# Patient Record
Sex: Female | Born: 1970 | Race: White | Hispanic: No | Marital: Married | Smoking: Former smoker
Health system: Southern US, Community
[De-identification: ages and names within clinical notes are randomized; demographics above are authoritative.]

## PROBLEM LIST (undated history)

## (undated) DIAGNOSIS — R109 Unspecified abdominal pain: Secondary | ICD-10-CM

## (undated) DIAGNOSIS — K921 Melena: Secondary | ICD-10-CM

## (undated) DIAGNOSIS — E538 Deficiency of other specified B group vitamins: Secondary | ICD-10-CM

## (undated) DIAGNOSIS — K219 Gastro-esophageal reflux disease without esophagitis: Secondary | ICD-10-CM

## (undated) DIAGNOSIS — R6884 Jaw pain: Secondary | ICD-10-CM

## (undated) DIAGNOSIS — R232 Flushing: Secondary | ICD-10-CM

## (undated) DIAGNOSIS — K2270 Barrett's esophagus without dysplasia: Secondary | ICD-10-CM

## (undated) DIAGNOSIS — D509 Iron deficiency anemia, unspecified: Secondary | ICD-10-CM

## (undated) DIAGNOSIS — M461 Sacroiliitis, not elsewhere classified: Secondary | ICD-10-CM

## (undated) DIAGNOSIS — R142 Eructation: Secondary | ICD-10-CM

## (undated) DIAGNOSIS — R5383 Other fatigue: Secondary | ICD-10-CM

## (undated) DIAGNOSIS — R112 Nausea with vomiting, unspecified: Secondary | ICD-10-CM

## (undated) DIAGNOSIS — R634 Abnormal weight loss: Secondary | ICD-10-CM

## (undated) DIAGNOSIS — R141 Gas pain: Secondary | ICD-10-CM

## (undated) DIAGNOSIS — M25519 Pain in unspecified shoulder: Secondary | ICD-10-CM

## (undated) DIAGNOSIS — K5900 Constipation, unspecified: Secondary | ICD-10-CM

## (undated) DIAGNOSIS — R197 Diarrhea, unspecified: Secondary | ICD-10-CM

## (undated) DIAGNOSIS — J449 Chronic obstructive pulmonary disease, unspecified: Secondary | ICD-10-CM

## (undated) DIAGNOSIS — F32A Depression, unspecified: Secondary | ICD-10-CM

## (undated) DIAGNOSIS — E785 Hyperlipidemia, unspecified: Secondary | ICD-10-CM

## (undated) DIAGNOSIS — R079 Chest pain, unspecified: Secondary | ICD-10-CM

## (undated) DIAGNOSIS — R110 Nausea: Secondary | ICD-10-CM

## (undated) DIAGNOSIS — Z9889 Other specified postprocedural states: Secondary | ICD-10-CM

## (undated) DIAGNOSIS — K635 Polyp of colon: Secondary | ICD-10-CM

## (undated) DIAGNOSIS — R143 Flatulence: Secondary | ICD-10-CM

## (undated) DIAGNOSIS — F419 Anxiety disorder, unspecified: Secondary | ICD-10-CM

## (undated) DIAGNOSIS — M5412 Radiculopathy, cervical region: Secondary | ICD-10-CM

## (undated) HISTORY — DX: Hyperlipidemia, unspecified: E78.5

## (undated) HISTORY — DX: Nausea: R11.0

## (undated) HISTORY — DX: Other fatigue: R53.83

## (undated) HISTORY — DX: Jaw pain: R68.84

## (undated) HISTORY — DX: Gas pain: R14.1

## (undated) HISTORY — PX: TUBAL LIGATION: SHX77

## (undated) HISTORY — DX: Gastro-esophageal reflux disease without esophagitis: K21.9

## (undated) HISTORY — DX: Radiculopathy, cervical region: M54.12

## (undated) HISTORY — DX: Flushing: R23.2

## (undated) HISTORY — DX: Iron deficiency anemia, unspecified: D50.9

## (undated) HISTORY — DX: Chest pain, unspecified: R07.9

## (undated) HISTORY — DX: Anxiety disorder, unspecified: F41.9

## (undated) HISTORY — DX: Eructation: R14.2

## (undated) HISTORY — DX: Depression, unspecified: F32.A

## (undated) HISTORY — PX: CHOLECYSTECTOMY: SHX55

## (undated) HISTORY — DX: Diarrhea, unspecified: R19.7

## (undated) HISTORY — PX: COLONOSCOPY: SHX174

## (undated) HISTORY — DX: Polyp of colon: K63.5

## (undated) HISTORY — PX: FOOT SURGERY: SHX648

## (undated) HISTORY — DX: Constipation, unspecified: K59.00

## (undated) HISTORY — DX: Unspecified abdominal pain: R10.9

## (undated) HISTORY — DX: Sacroiliitis, not elsewhere classified: M46.1

## (undated) HISTORY — DX: Pain in unspecified shoulder: M25.519

## (undated) HISTORY — DX: Flatulence: R14.3

## (undated) HISTORY — DX: Melena: K92.1

## (undated) HISTORY — DX: Deficiency of other specified B group vitamins: E53.8

## (undated) HISTORY — DX: Abnormal weight loss: R63.4

## (undated) HISTORY — DX: Chronic obstructive pulmonary disease, unspecified: J44.9

---

## 1997-09-01 ENCOUNTER — Other Ambulatory Visit: Admission: RE | Admit: 1997-09-01 | Discharge: 1997-09-01 | Payer: Self-pay | Admitting: Obstetrics & Gynecology

## 1998-01-24 ENCOUNTER — Inpatient Hospital Stay (HOSPITAL_COMMUNITY): Admission: AD | Admit: 1998-01-24 | Discharge: 1998-01-24 | Payer: Self-pay | Admitting: Obstetrics and Gynecology

## 1998-04-04 ENCOUNTER — Inpatient Hospital Stay (HOSPITAL_COMMUNITY): Admission: AD | Admit: 1998-04-04 | Discharge: 1998-04-04 | Payer: Self-pay | Admitting: *Deleted

## 1998-04-12 ENCOUNTER — Inpatient Hospital Stay (HOSPITAL_COMMUNITY): Admission: AD | Admit: 1998-04-12 | Discharge: 1998-04-14 | Payer: Self-pay | Admitting: *Deleted

## 1998-05-28 ENCOUNTER — Ambulatory Visit (HOSPITAL_COMMUNITY): Admission: RE | Admit: 1998-05-28 | Discharge: 1998-05-28 | Payer: Self-pay | Admitting: Obstetrics and Gynecology

## 2000-01-21 ENCOUNTER — Emergency Department (HOSPITAL_COMMUNITY): Admission: EM | Admit: 2000-01-21 | Discharge: 2000-01-22 | Payer: Self-pay | Admitting: Emergency Medicine

## 2000-01-24 ENCOUNTER — Other Ambulatory Visit: Admission: RE | Admit: 2000-01-24 | Discharge: 2000-01-24 | Payer: Self-pay | Admitting: Obstetrics & Gynecology

## 2004-03-11 ENCOUNTER — Emergency Department (HOSPITAL_COMMUNITY): Admission: EM | Admit: 2004-03-11 | Discharge: 2004-03-11 | Payer: Self-pay | Admitting: Emergency Medicine

## 2005-07-30 ENCOUNTER — Emergency Department (HOSPITAL_COMMUNITY): Admission: EM | Admit: 2005-07-30 | Discharge: 2005-07-30 | Payer: Self-pay | Admitting: Emergency Medicine

## 2007-01-25 ENCOUNTER — Observation Stay (HOSPITAL_COMMUNITY): Admission: EM | Admit: 2007-01-25 | Discharge: 2007-01-26 | Payer: Self-pay | Admitting: Emergency Medicine

## 2009-03-05 ENCOUNTER — Encounter (INDEPENDENT_AMBULATORY_CARE_PROVIDER_SITE_OTHER): Payer: Self-pay | Admitting: *Deleted

## 2009-04-06 ENCOUNTER — Telehealth (INDEPENDENT_AMBULATORY_CARE_PROVIDER_SITE_OTHER): Payer: Self-pay | Admitting: *Deleted

## 2009-04-10 ENCOUNTER — Encounter (INDEPENDENT_AMBULATORY_CARE_PROVIDER_SITE_OTHER): Payer: Self-pay | Admitting: *Deleted

## 2009-04-10 ENCOUNTER — Ambulatory Visit: Payer: Self-pay | Admitting: Gastroenterology

## 2009-04-10 DIAGNOSIS — R198 Other specified symptoms and signs involving the digestive system and abdomen: Secondary | ICD-10-CM | POA: Insufficient documentation

## 2009-04-10 DIAGNOSIS — K921 Melena: Secondary | ICD-10-CM

## 2009-04-10 DIAGNOSIS — R143 Flatulence: Secondary | ICD-10-CM

## 2009-04-10 DIAGNOSIS — K5900 Constipation, unspecified: Secondary | ICD-10-CM | POA: Insufficient documentation

## 2009-04-10 DIAGNOSIS — J449 Chronic obstructive pulmonary disease, unspecified: Secondary | ICD-10-CM

## 2009-04-10 DIAGNOSIS — R142 Eructation: Secondary | ICD-10-CM

## 2009-04-10 DIAGNOSIS — R197 Diarrhea, unspecified: Secondary | ICD-10-CM

## 2009-04-10 DIAGNOSIS — R109 Unspecified abdominal pain: Secondary | ICD-10-CM | POA: Insufficient documentation

## 2009-04-10 DIAGNOSIS — R141 Gas pain: Secondary | ICD-10-CM | POA: Insufficient documentation

## 2009-04-10 DIAGNOSIS — J4489 Other specified chronic obstructive pulmonary disease: Secondary | ICD-10-CM

## 2009-04-10 DIAGNOSIS — R634 Abnormal weight loss: Secondary | ICD-10-CM | POA: Insufficient documentation

## 2009-04-10 HISTORY — DX: Abnormal weight loss: R63.4

## 2009-04-10 HISTORY — DX: Flatulence: R14.3

## 2009-04-10 HISTORY — DX: Constipation, unspecified: K59.00

## 2009-04-10 HISTORY — DX: Melena: K92.1

## 2009-04-10 HISTORY — DX: Other specified chronic obstructive pulmonary disease: J44.89

## 2009-04-10 HISTORY — DX: Unspecified abdominal pain: R10.9

## 2009-04-10 HISTORY — DX: Chronic obstructive pulmonary disease, unspecified: J44.9

## 2009-04-10 HISTORY — DX: Gas pain: R14.1

## 2009-04-10 HISTORY — DX: Diarrhea, unspecified: R19.7

## 2009-04-12 DIAGNOSIS — D509 Iron deficiency anemia, unspecified: Secondary | ICD-10-CM | POA: Insufficient documentation

## 2009-04-12 DIAGNOSIS — E538 Deficiency of other specified B group vitamins: Secondary | ICD-10-CM

## 2009-04-12 HISTORY — DX: Deficiency of other specified B group vitamins: E53.8

## 2009-04-12 HISTORY — DX: Iron deficiency anemia, unspecified: D50.9

## 2009-04-12 LAB — CONVERTED CEMR LAB
ALT: 16 units/L (ref 0–35)
AST: 18 units/L (ref 0–37)
Albumin: 4.3 g/dL (ref 3.5–5.2)
Amylase: 74 units/L (ref 27–131)
BUN: 15 mg/dL (ref 6–23)
Basophils Relative: 0 % (ref 0.0–3.0)
CO2: 27 meq/L (ref 19–32)
CRP, High Sensitivity: 0 (ref 0.00–5.00)
Chloride: 105 meq/L (ref 96–112)
Creatinine, Ser: 0.7 mg/dL (ref 0.4–1.2)
Eosinophils Absolute: 0.1 10*3/uL (ref 0.0–0.7)
Eosinophils Relative: 1.2 % (ref 0.0–5.0)
Glucose, Bld: 96 mg/dL (ref 70–99)
Hemoglobin: 13.8 g/dL (ref 12.0–15.0)
IgA: 255 mg/dL (ref 68–378)
Lipase: 23 units/L (ref 11.0–59.0)
Lymphocytes Relative: 31.7 % (ref 12.0–46.0)
MCHC: 34.2 g/dL (ref 30.0–36.0)
MCV: 91.3 fL (ref 78.0–100.0)
Neutro Abs: 5.3 10*3/uL (ref 1.4–7.7)
Potassium: 3.6 meq/L (ref 3.5–5.1)
RBC: 4.43 M/uL (ref 3.87–5.11)
Sed Rate: 8 mm/hr (ref 0–22)
TSH: 1.21 microintl units/mL (ref 0.35–5.50)
Total Bilirubin: 0.2 mg/dL — ABNORMAL LOW (ref 0.3–1.2)
Total Protein: 6.6 g/dL (ref 6.0–8.3)
Vitamin B-12: 265 pg/mL (ref 211–911)
WBC: 8.3 10*3/uL (ref 4.5–10.5)

## 2009-04-13 LAB — CONVERTED CEMR LAB: Tissue Transglutaminase Ab, IgA: 0.3 units (ref <7)

## 2009-04-16 ENCOUNTER — Ambulatory Visit: Payer: Self-pay | Admitting: Gastroenterology

## 2009-04-18 ENCOUNTER — Encounter: Payer: Self-pay | Admitting: Gastroenterology

## 2009-05-02 ENCOUNTER — Telehealth (INDEPENDENT_AMBULATORY_CARE_PROVIDER_SITE_OTHER): Payer: Self-pay | Admitting: *Deleted

## 2009-05-25 ENCOUNTER — Encounter: Payer: Self-pay | Admitting: Family Medicine

## 2009-06-19 ENCOUNTER — Emergency Department (HOSPITAL_BASED_OUTPATIENT_CLINIC_OR_DEPARTMENT_OTHER): Admission: EM | Admit: 2009-06-19 | Discharge: 2009-06-19 | Payer: Self-pay | Admitting: Emergency Medicine

## 2009-06-19 ENCOUNTER — Telehealth: Payer: Self-pay | Admitting: Gastroenterology

## 2009-06-19 ENCOUNTER — Ambulatory Visit: Payer: Self-pay | Admitting: Diagnostic Radiology

## 2009-06-26 ENCOUNTER — Encounter: Payer: Self-pay | Admitting: Family Medicine

## 2009-06-26 ENCOUNTER — Ambulatory Visit: Payer: Self-pay | Admitting: Sports Medicine

## 2009-06-26 DIAGNOSIS — M461 Sacroiliitis, not elsewhere classified: Secondary | ICD-10-CM

## 2009-06-26 DIAGNOSIS — M5412 Radiculopathy, cervical region: Secondary | ICD-10-CM

## 2009-06-26 HISTORY — DX: Sacroiliitis, not elsewhere classified: M46.1

## 2009-06-26 HISTORY — DX: Radiculopathy, cervical region: M54.12

## 2009-07-04 ENCOUNTER — Telehealth (INDEPENDENT_AMBULATORY_CARE_PROVIDER_SITE_OTHER): Payer: Self-pay | Admitting: *Deleted

## 2009-07-12 ENCOUNTER — Ambulatory Visit: Payer: Self-pay | Admitting: Gastroenterology

## 2009-07-20 ENCOUNTER — Ambulatory Visit: Payer: Self-pay | Admitting: Gastroenterology

## 2009-07-27 ENCOUNTER — Ambulatory Visit: Payer: Self-pay | Admitting: Gastroenterology

## 2009-11-21 ENCOUNTER — Emergency Department (HOSPITAL_COMMUNITY): Admission: EM | Admit: 2009-11-21 | Discharge: 2009-11-21 | Payer: Self-pay | Admitting: Emergency Medicine

## 2010-03-26 NOTE — Assessment & Plan Note (Signed)
Summary: rectal bleeding/weight loss--ch.   History of Present Illness Visit Type: Initial Visit Primary Provider: n/a Chief Complaint: Patient c/o RLQ abdominal pain which occurs almost constantly. She also c/o early satiety along with loss of appetite and visible bloating. She has had increased nausea but no vomiting. There has also been a 27 pound weight loss since November 2010. There is alternating constipation and diarrhea along with several epidsoes of dark black stools since Thanksgiving. History of Present Illness:   40 year old Caucasian female presents with a three-month history of crampy lower abdominal pain, melena, periodic diarrhea alternating with constipation and occasional melanotic stools.  Dondra appear less severe endometriosis in 2004 and was involved in a [facility] study and apparently had laparoscopy with lysis of multiple adhesions. She had done well until recently when she said lower bowel pain and abnormal menstrual bleeding. Apparently evaluation by Dr. Arelia Sneddon has showed an ovarian cyst but no evidence of recurrent endometriosis. She currently is on no medications. Her GI complaints seem unrelated to her gynecologic issues. She mostly has abdominal cramping, alternating diarrhea and constipation, low-grade fever, night sweats, arthralgias in her hands and feet, but no skin rashes, mouth sores or any hepatobiliary or upper gastrointestinal problems. She has not had previous GI barium studies or endoscopic exams.  She does use frequent NSAIDs which may be playing a role in her illness. She smokes at least a pack of cigarettes per day and denies ethanol abuse or any history of pancreatitis or hepatitis. Her family history is remarkable for colon polyps in  multiple family members.   GI Review of Systems    Reports abdominal pain, bloating, loss of appetite, nausea, and  weight loss.     Location of  Abdominal pain: RLQ. Weight loss of 27 pounds over 3 months.   Denies acid  reflux, belching, chest pain, dysphagia with liquids, dysphagia with solids, heartburn, vomiting, vomiting blood, and  weight gain.      Reports black tarry stools, change in bowel habits, constipation, diarrhea, rectal bleeding, and  rectal pain.     Denies anal fissure, diverticulosis, fecal incontinence, heme positive stool, hemorrhoids, irritable bowel syndrome, jaundice, light color stool, and  liver problems. Preventive Screening-Counseling & Management  Alcohol-Tobacco     Smoking Status: current      Drug Use:  no.      Current Medications (verified): 1)  None  Allergies (verified): No Known Drug Allergies  Past History:  Past medical, surgical, family and social histories (including risk factors) reviewed for relevance to current acute and chronic problems.  Past Medical History: Current Problems:  COPD (ICD-496) ABDOMINAL BLOATING (ICD-787.3) DIARRHEA (ICD-787.91) CONSTIPATION (ICD-564.00) EARLY SATIETY (ICD-789.9) WEIGHT LOSS (ICD-783.21) ABDOMINAL PAIN, UNSPECIFIED SITE (ICD-789.00) MELENA (ICD-578.1)    Past Surgical History: Tubal Ligation Lysis of Adhesions  Family History: Reviewed history and no changes required. Family History of Colon Cancer: Paternal Grandmother Family History of Colon Polyps: Grandmother, Father, Aunt x 2 Family History of Clotting disorder: Father  Social History: Reviewed history and no changes required. Patient currently smokes. -1 ppd Alcohol Use - yes-occasional Daily Caffeine Use-1-2 cups daily Illicit Drug Use - no Smoking Status:  current Drug Use:  no  Review of Systems       The patient complains of back pain, fatigue, menstrual pain, muscle pains/cramps, night sweats, nosebleeds, swollen lymph glands, and urination - excessive.  The patient denies allergy/sinus, anemia, anxiety-new, arthritis/joint pain, blood in urine, breast changes/lumps, change in vision, confusion, cough, coughing up  blood, depression-new,  fainting, fever, headaches-new, hearing problems, heart murmur, heart rhythm changes, itching, pregnancy symptoms, shortness of breath, skin rash, sleeping problems, sore throat, thirst - excessive , urination - excessive , urination changes/pain, urine leakage, vision changes, and voice change.   General:  Complains of sweats and weight loss; denies fever, chills, anorexia, fatigue, weakness, malaise, and sleep disorder; she apparently has lost 27 pounds in weight over the last several months. She denies nausea vomiting or early satiety.. Eyes:  Denies blurring, diplopia, irritation, discharge, vision loss, scotoma, eye pain, and photophobia. ENT:  Complains of nosebleeds; denies earache, ear discharge, tinnitus, decreased hearing, nasal congestion, loss of smell, sore throat, hoarseness, and difficulty swallowing. CV:  Denies chest pains, angina, palpitations, syncope, dyspnea on exertion, orthopnea, PND, peripheral edema, and claudication. Resp:  Complains of dyspnea with exercise and wheezing; denies dyspnea at rest, cough, sputum, coughing up blood, and pleurisy. GI:  Complains of diarrhea, constipation, change in bowel habits, and black BMs; denies difficulty swallowing, pain on swallowing, nausea, indigestion/heartburn, vomiting, vomiting blood, abdominal pain, jaundice, gas/bloating, bloody BM's, and fecal incontinence. GU:  Complains of urinary frequency; denies urinary burning, blood in urine, nocturnal urination, urinary incontinence, abnormal vaginal bleeding, amenorrhea, menorrhagia, vaginal discharge, pelvic pain, genital sores, painful intercourse, and decreased libido. MS:  Complains of joint pain / LOM, joint swelling, joint stiffness, and low back pain; denies joint deformity, muscle weakness, muscle cramps, muscle atrophy, leg pain at night, leg pain with exertion, and shoulder pain / LOM hand / wrist pain (CTS). Derm:  Complains of itching; denies rash, dry skin, hives, moles, warts, and  unhealing ulcers; She complains of resolving lymphadenopathy in her neck area.. Neuro:  Denies weakness, paralysis, abnormal sensation, seizures, syncope, tremors, vertigo, transient blindness, frequent falls, frequent headaches, difficulty walking, headache, sciatica, radiculopathy other:, restless legs, memory loss, and confusion. Psych:  Denies depression, anxiety, memory loss, suicidal ideation, hallucinations, paranoia, phobia, and confusion. Endo:  Complains of unusual weight change; denies cold intolerance, heat intolerance, polydipsia, polyphagia, polyuria, and hirsutism. Heme:  Denies bruising, bleeding, enlarged lymph nodes, and pagophagia. Allergy:  Denies hives, rash, sneezing, hay fever, and recurrent infections.  Vital Signs:  Patient profile:   40 year old female Height:      68 inches Weight:      165.50 pounds BMI:     25.26 BSA:     1.89 Pulse rate:   84 / minute Pulse rhythm:   regular BP sitting:   116 / 84  (left arm)  Vitals Entered By: Hortense Ramal CMA Duncan Dull) (April 10, 2009 3:38 PM)  Physical Exam  General:  Well developed, well nourished, no acute distress.healthy appearing.  Scattered spider telangiectasias noted over the posterior back area Head:  Normocephalic and atraumatic. Eyes:  PERRLA, no icterus. Mouth:  No deformity or lesions, dentition normal. Neck:  Supple; no masses or thyromegaly. Lungs:  Clear throughout to auscultation.wheezes bilateral.   Heart:  Regular rate and rhythm; no murmurs, rubs,  or bruits. Abdomen:  Soft, nontender and nondistended. No masses, hepatosplenomegaly or hernias noted. Normal bowel sounds. Rectal:  Normal exam.hemocult negative.   Msk:  Symmetrical with no gross deformities. Normal posture. Pulses:  Normal pulses noted. Extremities:  No clubbing, cyanosis, edema or deformities noted. Neurologic:  Alert and  oriented x4;  grossly normal neurologically. Skin:  Intact without significant lesions or rashes. Cervical  Nodes:  No significant cervical adenopathy. Axillary Nodes:  No significant axillary adenopathy. Inguinal Nodes:  No significant inguinal  adenopathy. Psych:  Alert and cooperative. Normal mood and affect.   Impression & Recommendations:  Problem # 1:  ABDOMINAL PAIN, UNSPECIFIED SITE (ICD-789.00) Assessment Improved  Unusual presentation---probable adhesions and irritable bowel syndrome, rule out inflammatory bowel disease. Labs and colonoscopy have been scheduled. I will place her on Pamine forte 5 mg twice a day with p.r.n. tramadol 50 mg every 6-8 hours pending further evaluation. I have ordered liver profile, amylase, lipase, CBC, sed rate, CRP, celiac panel, and malabsorption studies. She denies infectious disease exposure or any sick family members home. I am concerned about her weight loss and history of melanotic stools. Also we'll check endoscopy at the time of colonoscopy of her history of recurrent melena .Her GI issues may be related to heavy NSAID use with damage to the upper and lower intestinal tract. She is to avoid NSAIDs and use p.r.n. tramadol. Also we'll request records from Dr. Arelia Sneddon for review. The patient may need CT scan of her abdomen and pelvis depending on her clinical workup and course.  Orders: Colon/Endo (Colon/Endo)  Problem # 2:  DIARRHEA (ICD-787.91) Assessment: Improved  Actually, patient is now constipated which is somewhat confusing. She denies any stress, depression, or psychiatric disorders. She does have a strong family history of colonic polyposis and of course we need to exclude some type of genetic colonic polyposis syndrome  Orders: TLB-CBC Platelet - w/Differential (85025-CBCD) TLB-BMP (Basic Metabolic Panel-BMET) (80048-METABOL) TLB-Hepatic/Liver Function Pnl (80076-HEPATIC) TLB-TSH (Thyroid Stimulating Hormone) (84443-TSH) TLB-B12, Serum-Total ONLY (16109-U04) TLB-Ferritin (82728-FER) TLB-Folic Acid (Folate) (82746-FOL) TLB-IBC Pnl  (Iron/FE;Transferrin) (83550-IBC) TLB-Amylase (82150-AMYL) TLB-CRP-High Sensitivity (C-Reactive Protein) (86140-FCRP) TLB-Lipase (83690-LIPASE) TLB-Sedimentation Rate (ESR) (85652-ESR) T-Beta Carotene (54098-11914) T-Sprue Panel (Celiac Disease Aby Eval) (83516x3/[address]) TLB-IgA (Immunoglobulin A) (82784-IGA) Colon/Endo (Colon/Endo)  Problem # 3:  WEIGHT LOSS (ICD-783.21) Assessment: Deteriorated Malabsorption parameters ordered.  Problem # 4:  MELENA (ICD-578.1) Assessment: Improved  Probable resolved NSAID damage. We'll proceed with endoscopic exam and small bowel biopsy also.  Orders: TLB-CBC Platelet - w/Differential (85025-CBCD) TLB-BMP (Basic Metabolic Panel-BMET) (80048-METABOL) TLB-Hepatic/Liver Function Pnl (80076-HEPATIC) TLB-TSH (Thyroid Stimulating Hormone) (84443-TSH) TLB-B12, Serum-Total ONLY (78295-A21) TLB-Ferritin (82728-FER) TLB-Folic Acid (Folate) (82746-FOL) TLB-IBC Pnl (Iron/FE;Transferrin) (83550-IBC) TLB-Amylase (82150-AMYL) TLB-CRP-High Sensitivity (C-Reactive Protein) (86140-FCRP) TLB-Lipase (83690-LIPASE) TLB-Sedimentation Rate (ESR) (85652-ESR) T-Beta Carotene (30865-78469) T-Sprue Panel (Celiac Disease Aby Eval) (83516x3/[address]) TLB-IgA (Immunoglobulin A) (82784-IGA)  Patient Instructions: 1)  Copy sent to : Dr. Richardean Chimera..records requested. 2)  Labs pending 3)  Endoscopy and colonoscopy scheduled 4)  Pamine forte 5 mg twice a day 5)  Avoid NSAIDs and use p.r.n Tramadol 50 mg every 6-8 hours as needed for pain 6)  Conscious Sedation brochure given.  7)  Sample of Movi prep given to pt. 8)  The medication list was reviewed and reconciled.  All changed / newly prescribed medications were explained.  A complete medication list was provided to the patient / caregiver. Prescriptions: TRAMADOL HCL 50 MG TABS (TRAMADOL HCL) 1 by mouth q 4-6 hrs as needed  #30 x 1   Entered by:   Ashok Cordia RN   Authorized by:   Mardella Layman MD  [facility]   Signed by:   Ashok Cordia RN on 04/10/2009   Method used:   Electronically to        [facility]  [address] [phone][facility] (retail)       [address]       [place], [place]  [address]       Ph:  [phone] or [phone]       Fax: [phone]   RxID:   [id number] PAMINE FORTE 5 MG TABS (METHSCOPOLAMINE BROMIDE) 1 by mouth bid  #60 x 3   Entered by:   Ashok Cordia RN   Authorized by:   Mardella Layman MD [facility]   Signed by:   Ashok Cordia RN on 04/10/2009   Method used:   Electronically to        [facility]  [address] [phone]* (retail)       [address] [place], [place]  [address]       Ph: [phone] or [phone]       Fax: [phone]   RxID:   [id number]

## 2010-03-26 NOTE — Letter (Signed)
Summary: [facility] rehabilitation referral form  [facility] rehabilitation referral form   Imported By: Marily Memos 06/26/2009 14:07:03  _____________________________________________________________________  External Attachment:    Type:   Image     Comment:   External Document

## 2010-03-26 NOTE — Assessment & Plan Note (Signed)
Summary: NP,LOWER BACK/HIP PAIN,MC   Vital Signs:  Patient profile:   40 year old female BP sitting:   113 / 76  Vitals Entered By: Lillia Pauls CMA (Jun 26, 2009 11:38 AM)  History of Present Illness:  40 year old pleasant female who presents as self-referral: went to get pap, was having some unusal abd problems, some parasthesias, ultimately was referred to Dr. Jarold Motto for evaluation. Has undergone a large GI eval.  Sacroilitis: Primary complaint is back pain that localizes to R SI joint. r sided back pain long time 6 months since november has dramatically changed No specific injury that she can remember  lays in bath ice ibuprofen also took some muscle relaxers --- made her too groggy  at home massage from family no chiro no pt  R sided radiculopathy and shoulder blade pain. No pain with reaching overhead. No trauma and no elbow or wrist pain.  Allergies: No Known Drug Allergies  Past History:  Past medical, surgical, family and social histories (including risk factors) reviewed, and no changes noted (except as noted below).  Past Medical History: COPD (ICD-496) ABDOMINAL BLOATING (ICD-787.3) DIARRHEA (ICD-787.91) CONSTIPATION (ICD-564.00) EARLY SATIETY (ICD-789.9) WEIGHT LOSS (ICD-783.21) ABDOMINAL PAIN, UNSPECIFIED SITE (ICD-789.00) MELENA (ICD-578.1)    Past Surgical History: Reviewed history from 04/10/2009 and no changes required. Tubal Ligation Lysis of Adhesions  Family History: Reviewed history from 04/10/2009 and no changes required. Family History of Colon Cancer: Paternal Grandmother Family History of Colon Polyps: Grandmother, Father, Aunt x 2 Family History of Clotting disorder: Father  Social History: Reviewed history from 04/10/2009 and no changes required. Patient currently smokes. -1 ppd Alcohol Use - yes-occasional Daily Caffeine Use-1-2 cups daily Illicit Drug Use - no  Review of Systems       REVIEW OF SYSTEMS  GEN: No  systemic complaints, no fevers, chills, sweats, or other acute illnesses MSK: Detailed in the HPI GI: tolerating PO intake without difficulty Neuro: as described Otherwise the pertinent positives of the ROS are noted above.    Physical Exam  Additional Exam:  Gen: Well-developed,well-nourished,in no acute distress; alert,appropriate and cooperative throughout examination HEENT: Normocephalic and atraumatic without obvious abnormalities.  Ears, externally no deformities Pulm: Breathing comfortably in no respiratory distress Range of motion at  the waist: Flexion: no pain Extension: no pain Rotation: mild pain, most with pain and rotating stretching the R  No echymosis or edema Rises to examination table with mild difficulty Gait: nonantalgic   Inspection/Deformity: N Paraspinus T: n  B Ankle Dorsiflexion (L5,4): 5/5 B Great Toe Dorsiflexion (L5,4): 5/5 Heel Walk (L5): WNL Toe Walk (S1): WNL Rise/Squat (L4): WNL, mild pain  SENSORY B Medial Foot (L4): WNL B Dorsum (L5): WNL B Lateral (S1): WNL Light Touch: WNL Pinprick: WNL  REFLEXES Knee (L4): 2+ Ankle (S1): 2+  B SLR, seated: neg B SLR, supine: neg B FABER: neg B Reverse FABER: + markedkly on the R B Greater Troch: NT B Log Roll: neg B Stork: NT B Sciatic Notch: VERY TTP ON R Leg Lengths: equal    Wrist/Hand Exam  Wrist Exam:    Right:    Inspection:  Normal    Palpation:  Normal    Stability:  stable    Tenderness:  no    Swelling:  no    Erythema:  no    Range of Motion:       Flexion-Active: 80 degrees       Extension-Active: 80 degrees  Radial Deviation-Active: 15 degrees       Ulnar Deviation-Active: 30 degrees       Flexion-Passive: 80 degrees       Extension-Passive: 80 degrees       Radial Deviation-Passive: 15 degrees       Ulnar Deviation-Passive: 30 degrees    Left:    Inspection:  Normal    Palpation:  Normal    Stability:  stable    Tenderness:  no    Swelling:  no     Erythema:  no    Range of Motion:       Flexion-Active: 80 degrees       Extension-Active: 80 degrees       Radial Deviation-Active: 15 degrees       Ulnar Deviation-Active: 30 degrees       Flexion-Passive: 80 degrees       Extension-Passive: 80 degrees       Radial Deviation-Passive: 15 degrees       Ulnar Deviation-Passive: 30 degrees  Tinel's:    Tinel's negative over cubital, pronator, carpal, and Guyon's area.     Shoulder/Elbow Exam  Shoulder Exam:    Right:    Inspection:  Normal    Palpation:  Normal    Stability:  stable    Tenderness:  no    Swelling:  no    Erythema:  no    Range of Motion:       Flexion-Active: 180       Extension-Active: 45       Flexion-Passive: 180       Extension-Passive: 45       External Rotation : 45       Interior Rotation : T7    Left:    Inspection:  Normal    Palpation:  Normal    Stability:  stable    Tenderness:  no    Swelling:  no    Erythema:  no    Range of Motion:       Flexion-Active: 180       Extension-Active: 45       Flexion-Passive: 180       Extension-Passive: 45       External Rotation : 45       Interior Rotation : T7  Elbow Exam:    Right:    Inspection:  Normal    Palpation:  Normal    Stability:  stable    Tenderness:  no    Swelling:  no    Erythema:  no    Range of Motion:       Flexion-Active: 135       Extension-Active: 0       Flexion-Passive: 135       Extension-Passive: 0       Elbow Flexion: > 60 seconds    Left:    Inspection:  Normal    Palpation:  Normal    Stability:  stable    Tenderness:  no    Swelling:  no    Erythema:  no    Range of Motion:       Flexion-Active: 135       Extension-Active: 0       Flexion-Passive: 135       Extension-Passive: 0       Elbow Flexion: > 60 seconds  Impingement Sign NEER:    Right negative; Left negative Impingement Sign HAWKINS:    Right negative; Left negative Sulcus Sign:  Right negative; Left negative Yerguson:    Right  negative; Left negative Speeds:    Right negative; Left negative   Impression & Recommendations:  Problem # 1:  SACROILIITIS, RIGHT (ICD-720.2) Assessment New A rehabilitation program was given to the patient emphasizing increasing ROM at [facility] joint, increasing overall hip and piriformis flexibility, and hip flexor and abductor strength.  Part of this was given from P/I section and another handout custom made for patient.  Could benefit from PT, Chiropractic manipulation, and an SI inj   Formal PT to assist  >30 minutes spent in face to face time with patient, >50% spent in counselling or coordination of care   Problem # 2:  CERVICAL RADICULOPATHY, RIGHT (ICD-723.4) Assessment: New Do not suspect problem at wrist, elbow  Referred from Neck likely  trial of elavil  Complete Medication List: 1)  Pamine Forte 5 Mg Tabs (Methscopolamine bromide) .Marland Kitchen.. 1 by mouth bid 2)  Tramadol Hcl 50 Mg Tabs (Tramadol hcl) .Marland Kitchen.. 1 by mouth q 4-6 hrs as needed 3)  Tandem 162-115.2 Mg Caps (Ferrous fum-iron polysacch) .... One tab daily for 2 months 4)  Cyanocobalamin 1000 Mcg/ml Inj Soln (Cyanocobalamin) .Marland Kitchen.. 1 cc im weekly x 3 then monthly. 5)  Amitriptyline Hcl 50 Mg Tabs (Amitriptyline hcl) .Marland Kitchen.. 1 by mouth at bedtime  Patient Instructions: 1)  SI JOINT DYSFUNCTION: 2)  1. Work on pretzel stretching, shoulder back and leg draped in front. 3-5 sets, 30 sec.. 3)  2. hip abductor rotations. standing, hip flexion and rotation outward then inward. 3 sets, 15 reps. when can do comfortably, add ankle weights starting at 2 pounds.  4)  3. cross over stretching - shoulder back to ground, same side leg crossover. 3-5 sets for 30 min..  5)  4. rolling up and back knees to chest and rocking. 6)  5. sacral tilt - 5 sets, hold for 5-10 seconds 7)  Also work on posture drills given, shoulder drills  Prescriptions: AMITRIPTYLINE HCL 50 MG TABS (AMITRIPTYLINE HCL) 1 by mouth at bedtime  #30 x 2   Entered and  Authorized by:   Hannah Beat MD   Signed by:   Hannah Beat MD on 06/26/2009   Method used:   Print then Give to Patient   RxID:   [id number]

## 2010-03-26 NOTE — Assessment & Plan Note (Signed)
Summary: b-14/yf  Nurse Visit   Allergies: No Known Drug Allergies  Medication Administration  Injection # 1:    Medication: Vit B12 1000 mcg    Diagnosis: B12 DEFICIENCY (ICD-266.2)    Route: IM    Site: L deltoid    Exp Date: 03/28/2011    Lot #: [id number]    Mfr: [employer] Regent    Patient tolerated injection without complications    Given by: Lowry Ram NCMA (Jul 12, 2009 11:35 AM)  Orders Added: 1)  Vit B12 1000 mcg [J3420] Next Vit B12 Injection #2 of 3 scheduled for  07-19-09 @ 11:15 AM.

## 2010-03-26 NOTE — Miscellaneous (Signed)
Summary: Clotest  Clinical Lists Changes  Orders: Added new Test order of TLB-H Pylori Screen Gastric Biopsy (83013-CLOTEST) - Signed 

## 2010-03-26 NOTE — Letter (Signed)
Summary: [facility] Instructions  [facility]  [address] [place], [place] [address]   Phone: [phone]  Fax: [phone]       Tanya Kelley    June 10, 1970    MRN: [id number]        Procedure Day Dorna Bloom: Wednesday, 04/18/09     Arrival Time: 1:00      Procedure Time: 2:00     Location of Procedure:                    _X _  [facility] (4th Floor)                         PREPARATION FOR COLONOSCOPY WITH MOVIPREP   Starting 5 days prior to your procedure 04/13/09 do not eat nuts, seeds, popcorn, corn, beans, peas,  salads, or any raw vegetables.  Do not take any fiber supplements (e.g. Metamucil, Citrucel, and Benefiber).  THE DAY BEFORE YOUR PROCEDURE         DATE: 04/17/09  DAY: Thursday  1.  Drink clear liquids the entire day-NO SOLID FOOD  2.  Do not drink anything colored red or purple.  Avoid juices with pulp.  No orange juice.  3.  Drink at least 64 oz. (8 glasses) of fluid/clear liquids during the day to prevent dehydration and help the prep work efficiently.  CLEAR LIQUIDS INCLUDE: Water Jello Ice Popsicles Tea (sugar ok, no milk/cream) Powdered fruit flavored drinks Coffee (sugar ok, no milk/cream) Gatorade Juice: apple, white grape, white cranberry  Lemonade Clear bullion, consomm, broth Carbonated beverages (any kind) Strained chicken noodle soup Hard Candy                             4.  In the morning, mix first dose of MoviPrep solution:    Empty 1 Pouch A and 1 Pouch B into the disposable container    Add lukewarm drinking water to the top line of the container. Mix to dissolve    Refrigerate (mixed solution should be used within 24 hrs)  5.  Begin drinking the prep at 5:00 p.m. The MoviPrep container is divided by 4 marks.   Every 15 minutes drink the solution down to the next mark (approximately 8 oz) until the full liter is complete.   6.  Follow completed prep with 16 oz of clear liquid of your choice  (Nothing red or purple).  Continue to drink clear liquids until bedtime.  7.  Before going to bed, mix second dose of MoviPrep solution:    Empty 1 Pouch A and 1 Pouch B into the disposable container    Add lukewarm drinking water to the top line of the container. Mix to dissolve    Refrigerate  THE DAY OF YOUR PROCEDURE      DATE: 04/18/09 DAY: Wednesday  Beginning at 9:00 a.m. (5 hours before procedure):         1. Every 15 minutes, drink the solution down to the next mark (approx 8 oz) until the full liter is complete.  2. Follow completed prep with 16 oz. of clear liquid of your choice.    3. You may drink clear liquids until 12:00 (2 HOURS BEFORE PROCEDURE).   MEDICATION INSTRUCTIONS  Unless otherwise instructed, you should take regular prescription medications with a small sip of water   as early as possible the morning of your  procedure.                 OTHER INSTRUCTIONS  You will need a responsible adult at least 38 years of age to accompany you and drive you home.   This person must remain in the waiting room during your procedure.  Wear loose fitting clothing that is easily removed.  Leave jewelry and other valuables at home.  However, you may wish to bring a book to read or  an iPod/MP3 player to listen to music as you wait for your procedure to start.  Remove all body piercing jewelry and leave at home.  Total time from sign-in until discharge is approximately 2-3 hours.  You should go home directly after your procedure and rest.  You can resume normal activities the  day after your procedure.  The day of your procedure you should not:   Drive   Make legal decisions   Operate machinery   Drink alcohol   Return to work  You will receive specific instructions about eating, activities and medications before you leave.    The above instructions have been reviewed and explained to me by   _______________________    I fully understand  and can verbalize these instructions _____________________________ Date _________

## 2010-03-26 NOTE — Letter (Signed)
Summary: [facility] Biopsy Results  [facility]  [address] [place], [place] [address]   Phone: [phone]  Fax: [phone]        April 18, 2009 MRN: [id number]    Tanya Kelley [address] [place], [place]  [address]    Dear [name],  I am pleased to inform you that the biopsies taken during your recent endoscopic examination did not show any evidence of cancer upon pathologic examination.  Additional information/recommendations:  __No further action is needed at this time.  Please follow-up with      your primary care physician for your other healthcare needs.  __ Please call [phone] to schedule a return visit to review      your condition.  _xx_ Continue with the treatment plan as outlined on the day of your      exam.  __ You should have a repeat endoscopic examination for this problem              in _ months/years.   Please call us if you are having persistent problems or have questions about your condition that have not been fully answered at this time.  Sincerely,  Mardella Layman MD [facility]  This letter has been electronically signed by your physician.  Appended Document: Patient Notice-Endo Biopsy Results  Letter mailed 2.24.11

## 2010-03-26 NOTE — Assessment & Plan Note (Signed)
Summary: B12 Inj, # 2 of 3 weekly  Nurse Visit   Medication Administration  Injection # 1:    Medication: Vit B12 1000 mcg    Diagnosis: B12 DEFICIENCY (ICD-266.2)    Route: IM    Site: R deltoid    Exp Date: 05/2011    Lot #: [id number]    Comments: Manufactured by [employer] Pt will return on 6/3 for next injection.    Patient tolerated injection without complications    Given by: Francee Piccolo CMA Duncan Dull) (Jul 20, 2009 9:20 AM)  Orders Added: 1)  Vit B12 1000 mcg [J3420]

## 2010-03-26 NOTE — Progress Notes (Signed)
Summary: B12 injections  Phone Note Outgoing Call   Call placed by: Ashok Cordia RN,  Jul 04, 2009 4:19 PM Summary of Call: Lm for pt to call.  Pt called earlier today and wanted to get B12 inj here.  Will discuss with pt using the nascobal. Initial call taken by: Ashok Cordia RN,  Jul 04, 2009 4:20 PM  Follow-up for Phone Call        Talked with pt.  SHe has not started getting B12 inj yet.  Did not go to PCP office a previously discussed. SHe will come in today for first B12.  Information re nascobal will be given to pt.  (shortage of injectable B12)  Pt will get sencod inj if med is available.  If not pt will begin nascobal. Follow-up by: Ashok Cordia RN,  Jul 06, 2009 10:23 AM    New/Updated Medications: NASCOBAL 500 MCG/0.1ML SOLN (CYANOCOBALAMIN) 1 spray once a week NASCOBAL 500 MCG/0.1ML SOLN (CYANOCOBALAMIN) 1 spray once a week Prescriptions: NASCOBAL 500 MCG/0.1ML SOLN (CYANOCOBALAMIN) 1 spray once a week  #1 x 6   Entered by:   Ashok Cordia RN   Authorized by:   Mardella Layman MD [facility]   Signed by:   Ashok Cordia RN on 07/06/2009   Method used:   Print then Give to Patient   RxID:   [id number]   Appended Document: B12 injections Sample, Rx and discount card for nascobal left at front desk.  Pt resch appt for inj   Clinical Lists Changes

## 2010-03-26 NOTE — Letter (Signed)
Summary: [facility]  [facility]   Imported By: Marily Memos 06/26/2009 11:24:58  _____________________________________________________________________  External Attachment:    Type:   Image     Comment:   External Document

## 2010-03-26 NOTE — Assessment & Plan Note (Signed)
Summary: B12 SHOT  Nurse Visit   Allergies: No Known Drug Allergies  Medication Administration  Injection # 1:    Medication: Vit B12 1000 mcg    Diagnosis: B12 DEFICIENCY (ICD-266.2)    Route: IM    Site: L deltoid    Exp Date: 05/2011    Lot #: [id number]    Mfr: [employer]    Patient tolerated injection without complications    Given by: Harlow Mares CMA (AAMA) (July 27, 2009 9:05 AM)

## 2010-03-26 NOTE — Procedures (Signed)
Summary: Upper Endoscopy  Patient: [name] Note: All result statuses are Final unless otherwise noted.  Tests: (1) Upper Endoscopy (EGD)   EGD Upper Endoscopy       DONE     [facility]     [address]     [place], [place]  [address]           ENDOSCOPY PROCEDURE REPORT           PATIENT:  Tanya Kelley, Tanya Kelley  MR#:  [id number]     BIRTHDATE:  1971/02/20, 38 yrs. old  GENDER:  female           ENDOSCOPIST:  Vania Rea. Jarold Motto, MD, [facility]     Referred by:           PROCEDURE DATE:  04/16/2009     PROCEDURE:  EGD with biopsy     ASA CLASS:  Class I     INDICATIONS:  iron deficiency anemia, diarrhea R/O CELIAC DISEASE.                 MEDICATIONS:   Fentanyl 25 mcg IV, There was residual sedation     effect present from prior procedure., Versed 2 mg IV,     glycopyrrolate (Robinal) 0.2 mg IV     TOPICAL ANESTHETIC:           DESCRIPTION OF PROCEDURE:   After the risks benefits and     alternatives of the procedure were thoroughly explained, informed     consent was obtained.  The LB GIF-H180 [id number] endoscope was     introduced through the mouth and advanced to the second portion of     the duodenum, without limitations.  The instrument was slowly     withdrawn as the mucosa was fully examined.     <<PROCEDUREIMAGES>>           Normal duodenal folds were noted. SMALL BOWEL BIOPSIES DONE.  The     esophagus and gastroesophageal junction were completely normal in     appearance.  The stomach was entered and closely examined. The     antrum, angularis, and lesser curvature were well visualized,     including a retroflexed view of the cardia and fundus. The stomach     wall was normally distensable. The scope passed easily through the     pylorus into the duodenum. CLO BX. DONE.    Retroflexed views     revealed no abnormalities.    The scope was then withdrawn from     the patient and the procedure completed.           COMPLICATIONS:  None           ENDOSCOPIC  IMPRESSION:     1) Normal duodenal folds     2) Normal esophagus     3) Normal stomach     R/O CELIAC DISEASE.     RECOMMENDATIONS:     1) await biopsy results     RX. GLUTEN FREE DIET PER ++ SEROLOGY.           REPEAT EXAM:  No           ______________________________     Vania Rea. Jarold Motto, MD, [facility]           CC:           n.     eSIGNED:   Vania Rea. Patterson at 04/16/2009 04:04 PM  Tanya Kelley, Tanya Kelley, [id number]  Note: An exclamation mark (!) indicates a result that was not dispersed into the flowsheet. Document Creation Date: 04/16/2009 4:05 PM _______________________________________________________________________  (1) Order result status: Final Collection or observation date-time: 04/16/2009 15:55 Requested date-time:  Receipt date-time:  Reported date-time:  Referring Physician:   Ordering Physician: Sheryn Bison [phone]) Specimen Source:  Source: Launa Grill Order Number: [id number] Lab site:

## 2010-03-26 NOTE — Progress Notes (Signed)
Summary: Gluten free diet  Phone Note Outgoing Call   Call placed by: Ashok Cordia RN,  May 02, 2009 3:29 PM Summary of Call: Lm for pt to call.  Per colon report pt needs to be on gluten free diet.  Does pt need info on this? Initial call taken by: Ashok Cordia RN,  May 02, 2009 3:30 PM  Follow-up for Phone Call        Talked with pt.  Information on gluten free diet mailed to pt. Pt will report back in one month. Follow-up by: Ashok Cordia RN,  May 02, 2009 4:14 PM

## 2010-03-26 NOTE — Letter (Signed)
Summary: New Patient letter  [facility]  [address] [place], [place] [address]   Phone: [phone]  Fax: [phone]       03/05/2009 MRN: [id number]  Tanya Kelley [address] [place], [place]  [address]  Dear Tanya Kelley,  Welcome to the Gastroenterology Division at [facility].    You are scheduled to see Dr. Arlyce Dice on 03/08/2009 at 2:00PM on the 3rd floor at [facility], [address]  We ask that you try to arrive at our office 15 minutes prior to your appointment time to allow for check-in.  We would like you to complete the enclosed self-administered evaluation form prior to your visit and bring it with you on the day of your appointment.  We will review it with you.  Also, please bring a complete list of all your medications or, if you prefer, bring the medication bottles and we will list them.  Being a self-pay patient, you will be required to pay $184.00 at the time of check-in before services are rendered.  This is considered your co-payment.  Co-payments are due at the time of your visit and may be paid by cash, check or credit card.  A flyer explaining financial asistance programs through [facility] has also been enclosed.     Your office visit will consist of a consult with your physician (includes a physical exam), any laboratory testing he/she may order, scheduling of any necessary diagnostic testing (e.g. x-ray, ultrasound, CT-scan), and scheduling of a procedure (e.g. Endoscopy, Colonoscopy) if required.  Please allow enough time on your schedule to allow for any/all of these possibilities.    If you cannot keep your appointment, please call [phone] to cancel or reschedule prior to your appointment date.  This allows [place] the opportunity to schedule an appointment for another patient in need of care.  If you do not cancel or reschedule by 5 p.m. the business day prior to your appointment date, you will be  charged a $50.00 late cancellation/no-show fee.    Thank you for choosing [facility] for your medical needs.  We appreciate the opportunity to care for you.  Please visit [place] at our website  to learn more about our practice.                     Sincerely,                                                             The Gastroenterology Division

## 2010-03-26 NOTE — Progress Notes (Signed)
Summary: Triage  Phone Note Call from Patient Call back at [phone]   Caller: Patient Call For: Dr. Jarold Motto Reason for Call: Talk to Nurse Summary of Call: Needs to speak w/you about getting a referral Initial call taken by: Karna Christmas,  June 19, 2009 2:40 PM  Follow-up for Phone Call        LM for pt to call.   Lupita Leash Surface RN  June 19, 2009 4:25 PM  LM of pt to call.   Lupita Leash Surface RN  June 20, 2009 10:18 AM  Pt states that she had mentioned to Dr. Jarold Motto that she was having lower back pain, numbness in legs and hands.  She went to ER and was told she needed to see orthopedist.  Pt is now under [facility] Fee Schedule and needs a referrel from a doctor in [facility] in order to recieve discounted price.  Asks if Dr. Eloise Harman can make the referral for her.  Was told to go to  [facility].  Has appt with Dr. Patsy Lager. Follow-up by: Ashok Cordia RN,  June 21, 2009 4:41 PM  Additional Follow-up for Phone Call Additional follow up Details #1::        i care issue Additional Follow-up by: Mardella Layman MD FACG,  June 22, 2009 8:18 AM    Additional Follow-up for Phone Call Additional follow up Details #2::    i care issue Follow-up by: Mardella Layman MD [facility],  June 22, 2009 8:18 AM

## 2010-03-26 NOTE — Procedures (Signed)
Summary: Colonoscopy  Patient: [name] Note: All result statuses are Final unless otherwise noted.  Tests: (1) Colonoscopy (COL)   COL Colonoscopy           DONE     [facility]     [address]     [place], [place]  [address]           COLONOSCOPY PROCEDURE REPORT           PATIENT:  Tanya Kelley, Tanya Kelley  MR#:  [id number]     BIRTHDATE:  [birth year], 38 yrs. old  GENDER:  female           ENDOSCOPIST:  Vania Rea. Jarold Motto, MD, [facility]     Referred by:           PROCEDURE DATE:  04/16/2009     PROCEDURE:  Colonoscopy with snare polypectomy     ASA CLASS:  Class I     INDICATIONS:  Iron Deficiency Anemia, abdominal pain, iron     deficiency anemia, unexplained diarrhea           MEDICATIONS:   Fentanyl 125 mcg IV, Versed 12 mg IV           DESCRIPTION OF PROCEDURE:   After the risks benefits and     alternatives of the procedure were thoroughly explained, informed     consent was obtained.  Digital rectal exam was performed and     revealed no abnormalities.   The LB CF-H180AL [id number] endoscope     was introduced through the anus and advanced to the terminal ileum     which was intubated for a short distance, without limitations.     The quality of the prep was excellent, using MoviPrep.  The     instrument was then slowly withdrawn as the colon was fully     examined.     <<PROCEDUREIMAGES>>     FINDINGS:  A diminutive polyp was found at the hepatic flexure.     COLD SNARE REMOVED.  The terminal ileum appeared normal.  This was     otherwise a normal examination of the colon.   Retroflexed views     in the rectum revealed no abnormalities.    The scope was     then withdrawn from the patient and the procedure completed.           COMPLICATIONS:  None           ENDOSCOPIC IMPRESSION:     1) Diminutive polyp at the hepatic flexure     2) Normal terminal ileum     3) Otherwise normal examination     NO FINDINGS OF IBD.     RECOMMENDATIONS:     1) Follow up  colonoscopy in 5 years     2) Repeat Colonoscopy in 5 years.           REPEAT EXAM:  No           ______________________________     Vania Rea. Jarold Motto, MD, [facility]           CC:           n.     eSIGNED:   Vania Rea. Patterson at 04/16/2009 03:59 PM           Leanord Asal, [id number]  Note: An exclamation mark (!) indicates a result that was not dispersed into the flowsheet. Document Creation Date: 04/16/2009 3:59 PM _______________________________________________________________________  (1) Order result status:  Final Collection or observation date-time: 04/16/2009 15:48 Requested date-time:  Receipt date-time:  Reported date-time:  Referring Physician:   Ordering Physician: Sheryn Bison [phone]) Specimen Source:  Source: Launa Grill Order Number: [id number] Lab site:

## 2010-03-26 NOTE — Progress Notes (Signed)
Summary: Sooner Appt.  Phone Note Call from Patient Call back at [phone] Cell   Caller: Patient Call For: Dr. Arlyce Dice Reason for Call: Talk to Nurse Summary of Call: Pt has an appt. scheduled for 05-03-09 and wants a sooner appt. She is having rectal bleeding and has lost 27lbs. Family hx of colon cancer. Initial call taken by: Karna Christmas,  April 06, 2009 12:55 PM  Follow-up for Phone Call        Pt. has family history of colon cancer, grandmother. Since Thanksgiving she has been having intermittent rectal bleeding. Also has black,tarry stools at times. Abd. pain, she has seen her GYNO. and female issues r/o. Pt. wants to be seen by any MD ASAP. Cannot be seen on 04-09-09. She will see Dr.Patterson on 04-10-09 at 3:15pm. She will have her OBGYN. fax records to [place]. Pt. instructed to call back as needed.  Follow-up by: Laureen Ochs LPN,  April 06, 2009 2:34 PM

## 2010-05-06 ENCOUNTER — Encounter: Payer: Self-pay | Admitting: *Deleted

## 2010-05-09 LAB — URINE CULTURE
Culture  Setup Time: 201109290206
Culture: NO GROWTH

## 2010-05-09 LAB — URINALYSIS, ROUTINE W REFLEX MICROSCOPIC
Bilirubin Urine: NEGATIVE
Glucose, UA: NEGATIVE mg/dL
Protein, ur: NEGATIVE mg/dL
Urobilinogen, UA: 0.2 mg/dL (ref 0.0–1.0)

## 2010-05-09 LAB — DIFFERENTIAL
Lymphocytes Relative: 28 % (ref 12–46)
Lymphs Abs: 2.4 10*3/uL (ref 0.7–4.0)
Monocytes Relative: 4 % (ref 3–12)
Neutro Abs: 5.9 10*3/uL (ref 1.7–7.7)
Neutrophils Relative %: 68 % (ref 43–77)

## 2010-05-09 LAB — COMPREHENSIVE METABOLIC PANEL
CO2: 25 mEq/L (ref 19–32)
Calcium: 9.6 mg/dL (ref 8.4–10.5)
Creatinine, Ser: 0.74 mg/dL (ref 0.4–1.2)
GFR calc Af Amer: >60 mL/min (ref >60)
GFR calc non Af Amer: >60 mL/min (ref >60)
Glucose, Bld: 87 mg/dL (ref 70–99)
Total Protein: 7.5 g/dL (ref 6.0–8.3)

## 2010-05-09 LAB — CBC
HCT: 40 % (ref 36.0–46.0)
Hemoglobin: 14 g/dL (ref 12.0–15.0)
MCH: 32 pg (ref 26.0–34.0)
MCHC: 34.9 g/dL (ref 30.0–36.0)
RDW: 12.4 % (ref 11.5–15.5)

## 2010-05-09 LAB — URINE MICROSCOPIC-ADD ON

## 2010-07-09 NOTE — H&P (Signed)
NAME:  Tanya Kelley, Tanya Kelley NO.:  [id number]   MEDICAL RECORD NO.:  [id number]          PATIENT TYPE:  INP   LOCATION:  1826                         FACILITY:  [facility]   PHYSICIAN:  Altha Harm, MDDATE OF BIRTH:  1970-12-03   DATE OF ADMISSION:  01/25/2007  DATE OF DISCHARGE:                              HISTORY & PHYSICAL   CHIEF COMPLAINT:  Chest pain.   HISTORY OF PRESENT ILLNESS:  This is a 40 year old Caucasian female with  a history of smoking who went to her doctor's office this morning with a  complaint of chest pain.  She states that at the doctor's office she was  noted to have an elevated heart rate with a right bundle branch block.  She stated that her heart rate continued to escalate, and EMS was  called, and the patient was transported to the hospital.  The patient  states that her chest pain is about 6/10.  It was non-radiating, but she  states that in addition to the chest pain, she also had tingling in the  bilateral upper extremities, swelling of the bilateral upper  extremities, shortness of breath and diaphoresis.  The patient states  she has never had any episodes similar to this in the past.  She has had  no fever or chills.  She has had no nausea, vomiting or diarrhea.   PAST MEDICAL HISTORY:  Hyperlipidemia treated with diet.   FAMILY HISTORY:  The patient has coronary artery disease in her  grandfather who died at about age 30; however, no history of coronary  artery disease in her first degree relatives.   SOCIAL HISTORY:  The patient is a smoker, one pack per day x20 years.  She denies any alcohol, tobacco or drug use.  She is married and lives  with her husband and 3 children.   CURRENT MEDICATIONS:  The patient takes no current medications, except  occasionally Motrin.   ALLERGIES:  No known drug allergies.   PRIMARY CARE PHYSICIAN:  Tammy R. Collins Scotland, M.D.   REVIEW OF SYSTEMS:  The patient states that she has been having some  discomfort in the bilateral lower quadrant.  She had been having a  significant cough recently.  Otherwise, 12 systems were reviewed, and  all systems are negative, except as noted in the HPI.   STUDIES DONE IN THE EMERGENCY ROOM:  Hemogram done showed white blood  cells of 6.8, hemoglobin 13, hematocrit 37.9, platelet count of 179.  Sodium 139, potassium 3.3, chloride 111, bicarbonate 18, BUN 8,  creatinine 0.7.  Plan of care markers - myoglobin 60.2, CK-MB less than  1, troponin less than 0.05.   PHYSICAL EXAMINATION:  GENERAL:  The patient is laying in bed, resting  comfortably.  She denies any chest pain at this time or symptoms while  in the emergency room.  VITAL SIGNS:  Temperature 97.1, blood pressure 125/76, heart rate  initially 111 upon arrival; now 86.  O2 saturation is 100% on room air.  HEENT:  Normocephalic and atraumatic.  Pupils equal, round and reactive  to light and accommodation.  Extraocular movements  are intact.  Fundi  are benign.  Tympanic membranes are translucent bilaterally with good  landmarks.  Nasal mucosa showed no polyps.  Oropharynx is moist.  No  exudate, erythema or lesion are noted.  NECK:  Trachea is midline.  No masses, no thyromegaly, no JVD, no  carotid bruit.  CARDIOVASCULAR:  The patient has a normal S1 and S2.  No murmurs, rubs,  or gallops were noted.  PMI was not displaced.  No heaves or thrills on  palpation.  ABDOMEN:  Obese, soft.  The patient has got mild suprapubic tenderness.  No masses, no hepatosplenomegaly noted.  LYMPH NODE SURVEY:  She has no cervical, axillary or inguinal  lymphadenopathy noted.  MUSCULOSKELETAL:  She has got no warmth, swelling or erythema around the  joints.  She has normal range of motion of joints of the bilateral upper  and lower extremities.  There is some spinal tenderness noted.  NEUROLOGIC:  Strength in bilateral upper and lower extremities 4+/5.  DTRs were 2+ bilaterally in the upper and lower  extremities.  Sensation  is intact to light touch, pink prick and proprioception.  PSYCHIATRIC:  The patient is alert and oriented x3.  Good insight and  cognition.  Good recent and remote recall.   ASSESSMENT AND PLAN:  This is a patient who presents with:  1. Chest pain, likely noncardiac in nature; however, the patient has      risk factors of both hyperlipidemia and smoking and a family      history of coronary artery disease.  The patient will be brought in      on an observation basis, enzymes will be cycled, and if enzymes are      negative, then will proceed with a stress test.  2. The patient probably has a small component of acute bronchitis      causing her to cough and lending itself to some dehydration .  I      will start IV Fluids and check orrthostatics  3. In terms of suprapubic pain, this patient is a sexually active      female presenting with suprapubic tenderness.  I will go ahead and      get a urinalysis on the patient and treat for a urinary tract      infection if results indicate that there is.  Otherwise, her      abdominal pain, if persistent, could be worked up as an outpatient.      I will also ask for an EKG to be done on the patient.  EKG has been      requested in the emergency room, but is unclear as to whether or      not it has been done.  I will ask for it to be done again and      evaluated for any acute ST-T wave changes.      Altha Harm, MD  Electronically Signed     MAM/MEDQ  D:  01/25/2007  T:  01/25/2007  Job:  [id number]   cc:   Tammy R. Collins Scotland, M.D.

## 2010-07-09 NOTE — Discharge Summary (Signed)
NAME:  Tanya Kelley, Tanya Kelley NO.:  [id number]   MEDICAL RECORD NO.:  [id number]          PATIENT TYPE:  OBV   LOCATION:  2024                         FACILITY:  [facility]   PHYSICIAN:  Altha Harm, MDDATE OF BIRTH:  01-02-1971   DATE OF ADMISSION:  01/25/2007  DATE OF DISCHARGE:  01/26/2007                               DISCHARGE SUMMARY   DISCHARGE DISPOSITION:  Home.   FINAL DISCHARGE DIAGNOSES:  1. Acute bronchitis.  2. Chest pain.  3. Tobacco use disorder.  4. Tachycardia, resolved.   DISCHARGE MEDICATIONS:  1. Azithromycin 250 mg p.o. daily x4 days.  2. Aspirin 81 mg p.o. daily.  3. Nitroglycerin 0.4 mg p.o. every 5 minutes x3 p.r.n.  4. Albuterol MDI 2 puffs p.o. every 4 hours p.r.n.   CONSULTATIONS:  Dr. Jacinto Halim, cardiology.   PROCEDURE:  None.   RADIOLOGIC STUDIES:  Chest x-ray 1-view which shows no active  cardiopulmonary disease.   PERTINENT LABORATORY STUDIES:  Cholesterol:  Total 168, HDL 31, LDL 121,  VLDL 16.  Serial troponins have been less than 0.01 with CK and CK MB  within normal range.  Sodium is 1140, potassium 4.1, chloride 111,  bicarbonate 21, BUN 8, creatinine 0.75.   CHIEF COMPLAINT:  Tachycardia.   HISTORY OF PRESENT ILLNESS:  Please see the H and P for details of the  HPI.  However, in short, the patient was seen in her doctor's office for  not feeling well.  The patient complained of chest pain and an EKG was  performed which showed that she was having tachycardia.  An ambulance  was called and the patient was sent to the emergency room for further  evaluation.   HOSPITAL COURSE:  1. Chest pain:  The patient in her history gave subjective history      consistent with an acute bronchitis.  The patient is a smoker and      is having cough productive of yellowish sputum with low-grade      temperatures.  This clinical consolation is consistent with an      acute bronchitis.  The patient is being treated with azithromycin  for this.  The patient has no hypoxia, and she appears to have no      respiratory compromise as a result of this.  The patient will also      be given beta-agonist inhalers to be used on a p.r.n. basis.   In terms of the chest pain, the patient is a smoker with hyperlipidemia  and a family history of coronary artery disease at a premature age.  In  light of all this, cardiology was asked to see the patient to arrange  for a stress test.  Dr. Jacinto Halim saw her and will arrange for her to have a  stress test as an outpatient when her respiratory symptoms have cleared  and she is in a convalescent state.  He felt that it was safe for her to  go home as all enzymes up to this point were negative.   1. Hyperlipidemia:  The patient does have hyperlipidemia and states  that she knows about this.  However, she has spoken with her doctor      and they have decided that she would get a trial of diet control      before starting on any medications.  The patient does not want any      medications initiated at this time, and wants to have it done with      her doctor, Dr. Collins Scotland.  I have explained to the patient that      starting on a statin would be cardio-protective in the event that      she is having acute coronary syndrome, and she still declines the      use of any medications at this time.  2. Tobacco use disorder:  The patient has been counseled on smoking      cessation, and is in a contemplative state stating that she is      willing to stop at this time.  The patient states that she does      have Chantix at home which has been prescribed by her physician and      she will initiate its use under the care of her primary care      physician.   DIETARY RESTRICTIONS:  The patient should be on a low cholesterol diet.   PHYSICAL RESTRICTIONS:  None.   FOLLOWUP:  The patient is to follow up with her primary care physician  on a p.r.n. basis and with Dr. Jacinto Halim in approximately 2 weeks after  her  acute bronchitis has resolved to schedule her stress test.      Altha Harm, MD  Electronically Signed     MAM/MEDQ  D:  01/26/2007  T:  01/26/2007  Job:  [id number]   cc:   Tammy R. Collins Scotland, M.D.  Cristy Hilts. Jacinto Halim, MD

## 2010-08-27 ENCOUNTER — Emergency Department (HOSPITAL_BASED_OUTPATIENT_CLINIC_OR_DEPARTMENT_OTHER)
Admission: EM | Admit: 2010-08-27 | Discharge: 2010-08-28 | Disposition: A | Discharge status: Home / Self Care | Payer: Self-pay | Attending: Emergency Medicine | Admitting: Emergency Medicine

## 2010-08-27 ENCOUNTER — Emergency Department (INDEPENDENT_AMBULATORY_CARE_PROVIDER_SITE_OTHER): Payer: Self-pay

## 2010-08-27 DIAGNOSIS — S52123A Displaced fracture of head of unspecified radius, initial encounter for closed fracture: Secondary | ICD-10-CM

## 2010-08-27 DIAGNOSIS — M25539 Pain in unspecified wrist: Secondary | ICD-10-CM

## 2010-08-27 DIAGNOSIS — W1789XA Other fall from one level to another, initial encounter: Secondary | ICD-10-CM

## 2010-08-27 DIAGNOSIS — M25529 Pain in unspecified elbow: Secondary | ICD-10-CM | POA: Insufficient documentation

## 2010-08-27 DIAGNOSIS — M25429 Effusion, unspecified elbow: Secondary | ICD-10-CM

## 2010-08-27 DIAGNOSIS — Y92009 Unspecified place in unspecified non-institutional (private) residence as the place of occurrence of the external cause: Secondary | ICD-10-CM | POA: Insufficient documentation

## 2010-09-05 ENCOUNTER — Ambulatory Visit: Payer: Self-pay | Admitting: Family Medicine

## 2010-12-02 LAB — CBC
HCT: 37.9
MCHC: 34.3
MCV: 89.8
RBC: 4.22
WBC: 6.8

## 2010-12-02 LAB — CK TOTAL AND CKMB (NOT AT ARMC)
CK, MB: 1
Relative Index: INVALID
Total CK: 72

## 2010-12-02 LAB — LIPID PANEL
HDL: 31 — ABNORMAL LOW
Total CHOL/HDL Ratio: 5.4
Triglycerides: 79
VLDL: 16

## 2010-12-02 LAB — DIFFERENTIAL
Basophils Relative: 1
Eosinophils Absolute: 0 — ABNORMAL LOW
Eosinophils Relative: 1
Lymphs Abs: 1.8
Monocytes Absolute: 0.3
Monocytes Relative: 4

## 2010-12-02 LAB — BASIC METABOLIC PANEL
CO2: 18 — ABNORMAL LOW
CO2: 21
Calcium: 8.6
Calcium: 8.9
Chloride: 111
Creatinine, Ser: 0.75
GFR calc Af Amer: >60
GFR calc Af Amer: >60
GFR calc non Af Amer: >60
Glucose, Bld: 94
Potassium: 3.3 — ABNORMAL LOW
Sodium: 139

## 2010-12-02 LAB — CARDIAC PANEL(CRET KIN+CKTOT+MB+TROPI)
CK, MB: 1
Relative Index: INVALID

## 2010-12-02 LAB — TROPONIN I: Troponin I: <0.01

## 2010-12-02 LAB — PHOSPHORUS: Phosphorus: 2.5

## 2010-12-02 LAB — POCT CARDIAC MARKERS: Troponin i, poc: <0.05

## 2011-02-25 DIAGNOSIS — K2270 Barrett's esophagus without dysplasia: Secondary | ICD-10-CM

## 2011-02-25 HISTORY — DX: Barrett's esophagus without dysplasia: K22.70

## 2015-03-29 ENCOUNTER — Emergency Department (INDEPENDENT_AMBULATORY_CARE_PROVIDER_SITE_OTHER): Payer: BLUE CROSS/BLUE SHIELD

## 2015-03-29 ENCOUNTER — Encounter: Payer: Self-pay | Admitting: *Deleted

## 2015-03-29 ENCOUNTER — Emergency Department
Admission: EM | Admit: 2015-03-29 | Discharge: 2015-03-29 | Disposition: A | Discharge status: Home / Self Care | Payer: BLUE CROSS/BLUE SHIELD | Source: Home / Self Care | Attending: Family Medicine | Admitting: Family Medicine

## 2015-03-29 DIAGNOSIS — R079 Chest pain, unspecified: Secondary | ICD-10-CM

## 2015-03-29 DIAGNOSIS — R110 Nausea: Secondary | ICD-10-CM

## 2015-03-29 DIAGNOSIS — R0602 Shortness of breath: Secondary | ICD-10-CM

## 2015-03-29 DIAGNOSIS — R000 Tachycardia, unspecified: Secondary | ICD-10-CM

## 2015-03-29 HISTORY — DX: Barrett's esophagus without dysplasia: K22.70

## 2015-03-29 LAB — TROPONIN I: Troponin I: <0.01 ng/mL (ref <0.06)

## 2015-03-29 LAB — LIPASE: Lipase: 11 U/L (ref 7–60)

## 2015-03-29 LAB — BASIC METABOLIC PANEL
BUN: 13 mg/dL (ref 7–25)
CO2: 24 mmol/L (ref 20–31)
Calcium: 9.3 mg/dL (ref 8.6–10.2)
Chloride: 105 mmol/L (ref 98–110)
Creat: 0.78 mg/dL (ref 0.50–1.10)
Glucose, Bld: 85 mg/dL (ref 65–99)
Potassium: 3.9 mmol/L (ref 3.5–5.3)
Sodium: 140 mmol/L (ref 135–146)

## 2015-03-29 LAB — TSH: TSH: 1.135 u[IU]/mL (ref 0.350–4.500)

## 2015-03-29 LAB — D-DIMER, QUANTITATIVE: D-Dimer, Quant: 0.3 ug/mL-FEU (ref 0.00–0.48)

## 2015-03-29 MED ORDER — ASPIRIN 325 MG PO TABS
325.0000 mg | ORAL_TABLET | Freq: Once | ORAL | Status: AC
  Administered 2015-03-29: 325 mg via ORAL

## 2015-03-29 MED ORDER — ONDANSETRON 4 MG PO TBDP
4.0000 mg | ORAL_TABLET | Freq: Once | ORAL | Status: AC
  Administered 2015-03-29: 4 mg via ORAL

## 2015-03-29 NOTE — ED Provider Notes (Signed)
CSN: [id number]     Arrival date & time 03/29/15  1512 History   First MD Initiated Contact with Patient 03/29/15 1522     Chief Complaint  Patient presents with  . Chest Pain  . Shortness of Breath   (Consider location/radiation/quality/duration/timing/severity/associated sxs/prior Treatment) Patient is a 45 y.o. female presenting with chest pain and shortness of breath.  Chest Pain Associated symptoms: abdominal pain (epigastric), back pain (Right upper), diaphoresis, fatigue, nausea, numbness, palpitations and shortness of breath   Associated symptoms: no cough, no fever, not vomiting and no weakness   Shortness of Breath Associated symptoms: abdominal pain (epigastric), chest pain and diaphoresis   Associated symptoms: no cough, no fever, no neck pain, no sore throat, no vomiting and no wheezing     Pt is a 45yo female presenting to [facility] with c/o intermittent chest pain, nausea, shortness of breath, diaphoresis, and tachycardia for 1 month.  Symptoms have been constant and worsening today.  Chest pain is centralized, aching and sharp, radiates up into Right shoulder.  Symptoms are worse with activity but tend to come even when she is at rest.  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  Denies personal hx of cardiac illness but states her paternal grandfather had a stroke "or something" in his 64s.  Pt notes her heart has been racing more at night and the other day she had an incident of numbness in her arms and legs, which eventually resolved on its own.  She has not had any aspirin today. Denies taking any nausea medication at home. Denies personal or family hx of thyroid disease.  Denies leg pain, swelling, or redness. No recent flights or surgeries. Denies hx of blood clots. She is not on any estrogen pills.  Denies hx of asthma or COPD. Denies  recent illness of cough or congestion.  Past Medical History  Diagnosis Date  . Barrett esophagus    Past Surgical History  Procedure Laterality Date  . Cholecystectomy    . Tubal ligation     History reviewed. No pertinent family history. Social History  Substance Use Topics  . Smoking status: Current Some Day Smoker  . Smokeless tobacco: Never Used  . Alcohol Use: Yes   OB History    No data available     Review of Systems  Constitutional: Positive for diaphoresis, appetite change and fatigue. Negative for fever and chills.  HENT: Negative for congestion, sore throat and voice change.   Respiratory: Positive for chest tightness and shortness of breath. Negative for cough, wheezing and stridor.   Cardiovascular: Positive for chest pain and palpitations. Negative for leg swelling.  Gastrointestinal: Positive for nausea and abdominal pain (epigastric). Negative for vomiting and diarrhea.  Musculoskeletal: Positive for back pain (Right upper). Negative for myalgias, arthralgias, gait problem, neck pain and neck stiffness.  Neurological: Positive for numbness. Negative for seizures, syncope and weakness.    Allergies  Review of patient's allergies indicates no known allergies.  Home Medications   Prior to Admission medications   Medication Sig Start Date End Date Taking? Authorizing Provider  omeprazole (PRILOSEC) 40 MG capsule Take 40 mg by mouth daily.   Yes Historical Provider, MD   Meds Ordered and Administered this Visit   Medications  aspirin tablet 325 mg (325 mg Oral Given 03/29/15 1538)  ondansetron (  ZOFRAN-ODT) disintegrating tablet 4 mg (4 mg Oral Given 03/29/15 1538)    BP 142/87 mmHg  Pulse 112  Resp 16  Wt 173 lb (78.472 kg)  SpO2 100% No data found.   Physical Exam  Constitutional: She is oriented to person, place, and time. She appears well-developed and well-nourished. She appears distressed.  Lying on flat on exam table, appears mildly anxious.    HENT:  Head: Normocephalic and atraumatic.  Eyes: Conjunctivae are normal. No scleral icterus.  Neck: Normal range of motion. Neck supple.  Cardiovascular: Regular rhythm and normal heart sounds.  Tachycardia present.   Tachycardia with regular rhythm  Pulmonary/Chest: Effort normal and breath sounds normal. No stridor. No respiratory distress. She has no wheezes. She has no rales. She exhibits no tenderness.  Abdominal: Soft. She exhibits no distension and no mass. There is tenderness. There is no rebound and no guarding.  Mild epigastric tenderness. Abdomen is soft, non-distended. No rebound, guarding, or masses.  Musculoskeletal: Normal range of motion.  Lymphadenopathy:    She has no cervical adenopathy.  Neurological: She is alert and oriented to person, place, and time.  Alert and oriented to person, place, and time. Speech is clear and goal oriented.  Skin: Skin is warm. She is diaphoretic.  Nursing note and vitals reviewed.   ED Course  Procedures (including critical care time)  Labs Review Labs Reviewed  BASIC METABOLIC PANEL  TSH  LIPASE  D-DIMER, QUANTITATIVE (NOT AT [facility])  TROPONIN I  CBC    Imaging Review Dg Chest 2 View  03/29/2015  CLINICAL DATA:  Chest pain, shortness of breath and dizziness today. Initial encounter. EXAM: CHEST  2 VIEW COMPARISON:  PA and lateral chest 11/23/2014. Single view of the chest 01/25/2007. FINDINGS: The lungs are clear. Heart size is normal. No pneumothorax or pleural effusion. No focal bony abnormality. IMPRESSION: No acute disease. Electronically Signed   By: Inge Rise M.D.   On: 03/29/2015 16:29    Date/Time:03/29/2015  15:18:21 Ventricular Rate:94 PR Interval: 118 QRS Duration: 88 QT Interval: 346 QTC Calculation: 405 Text Interpretation: Sinus rhythm- Short PR syndrome, PRi: 118, -RSR (V1)- nondiagnostic. Borderline rhythm.  No prior EKG to compare.   MDM   1. Chest pain, unspecified chest pain type    2. Shortness of breath   3. Nausea without vomiting   4. Tachycardia    Pt c/o 1 month of intermittent, worsening chest pain, diaphoresis, shortness of breath and nausea. Exam concerning for ACS vs aortic dissection vs esophageal rupture (less likely due to no vomiting or choking).  Pt is low risk for PE and has O2 Sat of 100%, however, with c/o chest pain or SOB, will still keep on differential.   Chest pain does not sound pleuritic and pt denies recent cough with congestion. Doubt pneumonia.  Symptoms could be due to anxiety and/or thyroid disease, however, due to pt currently being tachycardic with diaphoresis, nausea, epigastric pain and chest pain radiating to Right shoulder, strongly encouraged pt go to emergency department for further evaluation of symptoms as labs in UC will not come back immediately.    Pt is accompanied by her 73yo daughter who drove pt to [facility].   Pt declined transport to emergency department via EMS. Pt initially planned on going POV but talked herself out of going due to insurance concerns and costs.  Pt was able to be convinced to at least have labs drawn in UC and CXR.    Labs: CBC, BMP,  Troponin, D-dimer, Lipase pending CXR: normal, no pneumothorax or effusion   Reiterated they will not come back immediately so if symptoms continue to worsen, please call EMS or go to closest emergency department. Pt notes her husband will be with her at home and able to drive her if needed. Pt and daughter verbalized understanding and agreement with tx plan.    Noland Fordyce, PA-C 03/29/15 [phone]

## 2015-03-29 NOTE — ED Notes (Signed)
Patient taken to [facility] lab @ [facility] for blood draw and preparation. Advised to send out STAT

## 2015-03-29 NOTE — ED Notes (Signed)
Pt c/o 1 month of SOB which more recently is worse. Intermittent CP, sweats and increased HR x 1 month. EKG done and given to provider.

## 2015-03-30 LAB — CBC
HCT: 38.8 % (ref 36.0–46.0)
Hemoglobin: 13.1 g/dL (ref 12.0–15.0)
MCH: 30.1 pg (ref 26.0–34.0)
MCHC: 33.8 g/dL (ref 30.0–36.0)
MCV: 89.2 fL (ref 78.0–100.0)
MPV: 10.1 fL (ref 8.6–12.4)
Platelets: 202 10*3/uL (ref 150–400)
RBC: 4.35 MIL/uL (ref 3.87–5.11)
RDW: 13.6 % (ref 11.5–15.5)
WBC: 6.9 10*3/uL (ref 4.0–10.5)

## 2015-04-02 ENCOUNTER — Telehealth: Payer: Self-pay | Admitting: Emergency Medicine

## 2015-04-03 ENCOUNTER — Telehealth: Payer: Self-pay | Admitting: Emergency Medicine

## 2015-04-03 DIAGNOSIS — K2270 Barrett's esophagus without dysplasia: Secondary | ICD-10-CM | POA: Insufficient documentation

## 2015-04-03 DIAGNOSIS — R6884 Jaw pain: Secondary | ICD-10-CM | POA: Insufficient documentation

## 2015-04-03 DIAGNOSIS — R079 Chest pain, unspecified: Secondary | ICD-10-CM | POA: Insufficient documentation

## 2015-04-03 DIAGNOSIS — M25519 Pain in unspecified shoulder: Secondary | ICD-10-CM | POA: Insufficient documentation

## 2015-04-03 DIAGNOSIS — R232 Flushing: Secondary | ICD-10-CM | POA: Insufficient documentation

## 2015-04-03 DIAGNOSIS — R5383 Other fatigue: Secondary | ICD-10-CM | POA: Insufficient documentation

## 2015-04-03 DIAGNOSIS — R110 Nausea: Secondary | ICD-10-CM | POA: Insufficient documentation

## 2015-04-03 NOTE — Progress Notes (Signed)
Electrophysiology Office Note   Date:  04/04/2015   ID:  Tanya Kelley, DOB [birth year], MRN [id number]  PCP:  Howard Pouch, DO  Cardiologist:  Will Meredith Leeds, MD    No chief complaint on file.    History of Present Illness: Tanya Kelley is a 45 y.o. female who presents today for electrophysiology evaluation.   She presented on 03/29/15 with intermittent chest pain, nausea, shortness of breath, diaphoresis and tachycardia for one month. The symptoms have been worsening on the day of presentation. Her chest pain was centralized aching sharp and reading the right shoulder. Her symptoms were worse with activity but also happen at rest. She says that she does get the discomfort reproducibly when she does housework.She does have a history of Barrett's esophagus. Her heart has been racing more night.she says that she had a heart rate last night of 150.  Today, she denies symptoms of palpitations, chest pain, shortness of breath, orthopnea, PND, lower extremity edema, claudication, dizziness, presyncope, syncope, bleeding, or neurologic sequela. The patient is tolerating medications without difficulties and is otherwise without complaint today.    Past Medical History  Diagnosis Date  . Barrett esophagus   . Chest pain   . Shoulder pain   . Nausea   . Hot flashes   . Jaw pain   . Fatigue   . ABDOMINAL BLOATING 04/10/2009    Qualifier: Diagnosis of  By: Nelson-Smith CMA (AAMA), Dottie    . B12 DEFICIENCY 04/12/2009    Qualifier: Diagnosis of  By: Surface RN, Butch Penny    . ANEMIA, IRON DEFICIENCY 04/12/2009    Qualifier: Diagnosis of  By: Surface RN, Butch Penny    . COPD 04/10/2009    Qualifier: Diagnosis of  By: Nelson-Smith CMA (AAMA), Dottie    . CONSTIPATION 04/10/2009    Qualifier: Diagnosis of  By: Nelson-Smith CMA (AAMA), Dottie    . MELENA 04/10/2009    Qualifier: Diagnosis of  By: Nelson-Smith CMA (AAMA), Dottie    . SACROILIITIS, RIGHT 06/26/2009    Qualifier: Diagnosis of  By:  Lorelei Pont MD, Frederico Hamman    . CERVICAL RADICULOPATHY, RIGHT 06/26/2009    Qualifier: Diagnosis of  By: Lorelei Pont MD, Frederico Hamman    . WEIGHT LOSS 04/10/2009    Qualifier: Diagnosis of  By: Nelson-Smith CMA (AAMA), Dottie    . Diarrhea 04/10/2009    Qualifier: Diagnosis of  By: Nelson-Smith CMA (AAMA), Dottie    . Abdominal pain, unspecified site 04/10/2009    Qualifier: Diagnosis of  By: Nelson-Smith CMA (AAMA), Dottie     Past Surgical History  Procedure Laterality Date  . Cholecystectomy    . Tubal ligation       Current Outpatient Prescriptions  Medication Sig Dispense Refill  . aspirin 81 MG tablet Take 81 mg by mouth daily.    Marland Kitchen omeprazole (PRILOSEC) 40 MG capsule Take 40 mg by mouth daily.     No current facility-administered medications for this visit.    Allergies:   Review of patient's allergies indicates no known allergies.   Social History:  The patient  reports that she has been smoking.  She has never used smokeless tobacco. She reports that she drinks alcohol. She reports that she does not use illicit drugs.   Family History:  The patient's family history includes Healthy in her brother and mother; Heart attack in her paternal grandfather; Hypertension in her father.    ROS:  Please see the history of present illness.   Otherwise,  review of systems is positive for fatigue, chest pain, palpitations, DOE.   All other systems are reviewed and negative.    PHYSICAL EXAM: VS:  BP 116/80 mmHg  Pulse 91  Ht 5\' 8"  (1.727 m)  Wt 172 lb 12.8 oz (78.382 kg)  BMI 26.28 kg/m2 , BMI Body mass index is 26.28 kg/(m^2). GEN: Well nourished, well developed, in no acute distress HEENT: normal Neck: no JVD, carotid bruits, or masses Cardiac: RRR; no murmurs, rubs, or gallops,no edema  Respiratory:  clear to auscultation bilaterally, normal work of breathing GI: soft, nontender, nondistended, + BS MS: no deformity or atrophy Skin: warm and dry Neuro:  Strength and sensation are  intact Psych: euthymic mood, full affect  EKG:  EKG is ordered today. The ekg ordered today shows sinus rhythm, rate 91  Recent Labs: 03/29/2015: BUN 13; Creat 0.78; Hemoglobin 13.1; Platelets 202; Potassium 3.9; Sodium 140; TSH 1.135    Lipid Panel     Component Value Date/Time   CHOL  01/26/2007 0145    168        ATP III CLASSIFICATION:  <200     mg/dL   Desirable  200-239  mg/dL   Borderline High  >=240    mg/dL   High   TRIG 79 01/26/2007 0145   HDL 31* 01/26/2007 0145   CHOLHDL 5.4 01/26/2007 0145   VLDL 16 01/26/2007 0145   LDLCALC * 01/26/2007 0145    121        Total Cholesterol/HDL:CHD Risk Coronary Heart Disease Risk Table                     Men   Women  1/2 Average Risk   3.4   3.3     Wt Readings from Last 3 Encounters:  04/04/15 172 lb 12.8 oz (78.382 kg)  03/29/15 173 lb (78.472 kg)  04/10/09 165 lb 8 oz (75.07 kg)   ASSESSMENT AND PLAN:  1.  Chest pain: her pain is worse with exertion and improved with rest. It is somewhat reproducible. She also does have a smoking history. Due to the combination of symptoms will order an exercise Myoview to determine if she has coronary disease.  2. Palpitations:has had episodes of palpitations with heart rates up to 1 50 bpm. They happen almost daily. Due to that we will fit her with a 48 hour monitor to determine if she has  Current medicines are reviewed at length with the patient today.   The patient does not have concerns regarding her medicines.  The following changes were made today:  none  Labs/ tests ordered today include:  Orders Placed This Encounter  Procedures  . Myocardial Perfusion Imaging  . Holter monitor - 48 hour  . EKG 12-Lead     Disposition:   FU with Will Camnitz pending results above  Signed, Will Meredith Leeds, MD  04/04/2015 12:35 PM     [place] [address] [place] [address] [phone] (office) [phone] (fax)

## 2015-04-04 ENCOUNTER — Ambulatory Visit (INDEPENDENT_AMBULATORY_CARE_PROVIDER_SITE_OTHER): Payer: BLUE CROSS/BLUE SHIELD | Admitting: Cardiology

## 2015-04-04 ENCOUNTER — Encounter: Payer: Self-pay | Admitting: Cardiology

## 2015-04-04 VITALS — BP 116/80 | HR 91 | Ht 68.0 in | Wt 172.8 lb

## 2015-04-04 DIAGNOSIS — R002 Palpitations: Secondary | ICD-10-CM | POA: Diagnosis not present

## 2015-04-04 DIAGNOSIS — R079 Chest pain, unspecified: Secondary | ICD-10-CM | POA: Diagnosis not present

## 2015-04-04 NOTE — Patient Instructions (Signed)
Medication Instructions:  Your physician recommends that you continue on your current medications as directed. Please refer to the Current Medication list given to you today.  Labwork: None ordered  Testing/Procedures: Your physician has requested that you have en exercise stress myoview. For further information please visit [email or web address]. Please follow instruction sheet, as given.  Your physician has recommended that you wear a 48 hour holter monitor. Holter monitors are medical devices that record the heart's electrical activity. Doctors most often use these monitors to diagnose arrhythmias. Arrhythmias are problems with the speed or rhythm of the heartbeat. The monitor is a small, portable device. You can wear one while you do your normal daily activities. This is usually used to diagnose what is causing palpitations/syncope (passing out).  Follow-Up: Follow up to be determined once stress test and monitor results have been reviewed.  If you need a refill on your cardiac medications before your next appointment, please call your pharmacy.  Thank you for choosing [facility]!!

## 2015-04-06 ENCOUNTER — Encounter: Payer: Self-pay | Admitting: Family Medicine

## 2015-04-06 ENCOUNTER — Ambulatory Visit (INDEPENDENT_AMBULATORY_CARE_PROVIDER_SITE_OTHER): Payer: BLUE CROSS/BLUE SHIELD | Admitting: Family Medicine

## 2015-04-06 VITALS — BP 124/85 | HR 96 | Temp 97.9°F | Resp 20 | Ht 68.0 in | Wt 174.0 lb

## 2015-04-06 DIAGNOSIS — Z7689 Persons encountering health services in other specified circumstances: Secondary | ICD-10-CM

## 2015-04-06 DIAGNOSIS — Z13220 Encounter for screening for lipoid disorders: Secondary | ICD-10-CM

## 2015-04-06 DIAGNOSIS — Z0000 Encounter for general adult medical examination without abnormal findings: Secondary | ICD-10-CM

## 2015-04-06 DIAGNOSIS — Z131 Encounter for screening for diabetes mellitus: Secondary | ICD-10-CM

## 2015-04-06 DIAGNOSIS — Z114 Encounter for screening for human immunodeficiency virus [HIV]: Secondary | ICD-10-CM

## 2015-04-06 DIAGNOSIS — Z7189 Other specified counseling: Secondary | ICD-10-CM

## 2015-04-06 DIAGNOSIS — Z130 Encounter for screening for diseases of the blood and blood-forming organs and certain disorders involving the immune mechanism: Secondary | ICD-10-CM

## 2015-04-06 DIAGNOSIS — Z1321 Encounter for screening for nutritional disorder: Secondary | ICD-10-CM

## 2015-04-06 DIAGNOSIS — F17200 Nicotine dependence, unspecified, uncomplicated: Secondary | ICD-10-CM

## 2015-04-06 LAB — VITAMIN D 25 HYDROXY (VIT D DEFICIENCY, FRACTURES): VITD: 24.24 ng/mL — AB (ref 30.00–100.00)

## 2015-04-06 LAB — LIPID PANEL
CHOL/HDL RATIO: 5
Cholesterol: 256 mg/dL — ABNORMAL HIGH (ref 0–200)
HDL: 51.3 mg/dL (ref >39.00)
LDL Cholesterol: 190 mg/dL — ABNORMAL HIGH (ref 0–99)
NONHDL: 204.93
Triglycerides: 75 mg/dL (ref 0.0–149.0)
VLDL: 15 mg/dL (ref 0.0–40.0)

## 2015-04-06 LAB — VITAMIN B12: Vitamin B-12: 352 pg/mL (ref 211–911)

## 2015-04-06 LAB — FERRITIN: Ferritin: 74.8 ng/mL (ref 10.0–291.0)

## 2015-04-06 LAB — HEMOGLOBIN A1C: HEMOGLOBIN A1C: 5.5 % (ref 4.6–6.5)

## 2015-04-06 NOTE — Progress Notes (Signed)
Patient ID: Tanya Kelley, female   DOB: 1970/10/05, 45 y.o.   MRN: [id number]      Patient ID: Tanya Kelley, female  DOB: 1970-04-25, 45 y.o.   MRN: [id number]  Subjective:  Tanya Kelley is a 45 y.o. female present for establishment of care and annual exam All past medical history, surgical history, allergies, family history, immunizations, medications and social history were updated in the electronic medical record today. All recent labs, ED visits and hospitalizations within the last year were reviewed.  Chest pain/palpitations: She was recently seen in the emergency room approximately 1 week ago for intermittent chest pain, nausea, shortness of breath diaphoresis and tachycardia for approximately one month. She does report that her symptoms are worse with activity, such as vacuuming and cleaning the house. She states it also sometimes occurs with rest. She feels her heart racing, especially at night. Heart disease in early death in her father from a heart attack at age 13. Patient is taking an aspirin daily. She does have a history of Barrett's esophagus, that is due for follow-up she will. Patient does have a smoking history, and continues to smoke lightly. Patient has been evaluated by cardiology 2 days ago. Their plan is to perform an exercise Myoview and Holter monitoring for 48 hours.  Health maintenance:  Colonoscopy: Prior 2011, and 2013, March 2017 (polyps--> ) colonoscopy every 3 years.  Mammogram: No family hx, no personal history. [facility], all normal (dense tissue) had [place]. Dr. Jobe Igo [facility]. Menopause by blood work last period >2 years ago.  Cervical cancer screening: UTD April 2016, patient denies any abnormal Pap smears. Immunizations: tdap indicated, declined flu Infectious disease screening: HIV indicated  DEXA: prior irregular of unknown diagmosis, was told to take vit d and calcium ~2015  Past Medical History  Diagnosis Date  . Barrett  esophagus   . Chest pain   . Shoulder pain   . Nausea   . Hot flashes   . Jaw pain   . Fatigue   . ABDOMINAL BLOATING 04/10/2009    Qualifier: Diagnosis of  By: Nelson-Smith CMA (AAMA), Dottie    . B12 DEFICIENCY 04/12/2009    Qualifier: Diagnosis of  By: Surface RN, Butch Penny    . ANEMIA, IRON DEFICIENCY 04/12/2009    Qualifier: Diagnosis of  By: Surface RN, Butch Penny    . COPD 04/10/2009    Qualifier: Diagnosis of  By: Nelson-Smith CMA (AAMA), Dottie    . CONSTIPATION 04/10/2009    Qualifier: Diagnosis of  By: Nelson-Smith CMA (AAMA), Dottie    . MELENA 04/10/2009    Qualifier: Diagnosis of  By: Nelson-Smith CMA (AAMA), Dottie    . SACROILIITIS, RIGHT 06/26/2009    Qualifier: Diagnosis of  By: Lorelei Pont MD, Frederico Hamman    . CERVICAL RADICULOPATHY, RIGHT 06/26/2009    Qualifier: Diagnosis of  By: Lorelei Pont MD, Frederico Hamman    . WEIGHT LOSS 04/10/2009    Qualifier: Diagnosis of  By: Nelson-Smith CMA (AAMA), Dottie    . Diarrhea 04/10/2009    Qualifier: Diagnosis of  By: Nelson-Smith CMA (AAMA), Dottie    . Abdominal pain, unspecified site 04/10/2009    Qualifier: Diagnosis of  By: Nelson-Smith CMA (AAMA), Dottie    . Colon polyps    No Known Allergies Past Surgical History  Procedure Laterality Date  . Cholecystectomy    . Tubal ligation     Family History  Problem Relation Age of Onset  . Hypertension Father   . Healthy  Mother   . Healthy Brother   . Heart attack Paternal Grandfather   . Heart disease Paternal Grandfather   . Early death Paternal Grandfather 65    MI  . Colon cancer Paternal Grandmother 66    alive   Social History   Social History  . Marital Status: Married    Spouse Name: N/A  . Number of Children: N/A  . Years of Education: N/A   Occupational History  . Not on file.   Social History Main Topics  . Smoking status: Light Tobacco Smoker -- 0.25 packs/day for 27 years    Types: Cigarettes  . Smokeless tobacco: Never Used  . Alcohol Use: Yes     Comment: occassional     . Drug Use: No  . Sexual Activity: Yes    Birth Control/ Protection: Surgical     Comment: Tubal 2000   Other Topics Concern  . Not on file   Social History Narrative   Married, Lorin Mercy. 3 children Bobette Mo, [place], [place]).    Some college. 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    Drinks caffeine rarely,  Has recently switched to decaf.   Wears seatbelt, smoke detector in the home.    Firearms in a locked case.    Feels safe in relationships.       ROS: Negative, with the exception of above mentioned in HPI  Objective: BP 124/85 mmHg  Pulse 96  Temp(Src) 97.9 F (36.6 C) (Oral)  Resp 20  Ht 5\' 8"  (1.727 m)  Wt 174 lb (78.926 kg)  BMI 26.46 kg/m2  SpO2 98% Gen: Afebrile. No acute distress. Nontoxic in appearance, well-developed, well-nourished, Caucasian, female, pleasant HENT: AT. [place]. Bilateral TM visualized and normal in appearance, normal external auditory canal. MMM, no oral lesions, good dentition. Bilateral nares without erythema or swelling. Throat without erythema, ulcerations or exudates. No Cough on exam, no hoarseness on exam. Eyes:Pupils Equal Round Reactive to light, Extraocular movements intact,  Conjunctiva without redness, discharge or icterus. Neck/lymp/endocrine: Supple, no lymphadenopathy, no thyromegaly CV: RRR no m/c/g/r, no edema, +2/4 P posterior tibialis pulses.  Chest: CTAB, no wheeze, rhonchi or crackles. Normal Respiratory effort. No Air movement. Abd: Soft. Flat. ND. Mild discomfort right T11 lateral/posterior rib shaft. BS present. No Masses palpated. No hepatosplenomegaly. No rebound tenderness or guarding. Skin: No rashes, purpura or petechiae. Warm and well-perfused. Skin intact. Neuro/Msk:  Normal gait. PERLA. EOMi. Alert. Oriented x3.  Cranial nerves II through XII intact. Muscle strength 5/5 upper and lower extremity. DTRs equal bilaterally. Psych: Normal affect, dress and demeanor. Normal speech. Normal thought content and  judgment.  Assessment/plan: Tanya Kelley is a 45 y.o. female present for establishment of care with preventative exam. Encounter for preventive health examination Patient was encouraged to exercise greater than 150 minutes a week. Patient was encouraged to choose a diet filled with fresh fruits and vegetables, and lean meats. AVS provided to patient today for education/recommendation on gender specific health and safety maintenance. Health maintenance: Records are requested from prior providers Colonoscopy: Prior 2011, and 2013, March 2017 (polyps--> ) colonoscopy every 3 years.  Mammogram: No family hx, no personal history. [facility], all normal (dense tissue) had [place]. Dr. Jobe Igo [facility]. Menopause by blood work last period >2 years ago.  Cervical cancer screening: UTD April 2016, patient denies any abnormal Pap smears. Immunizations: tdap indicated, declined flu Infectious disease screening: HIV indicated  DEXA: prior irregular of unknown diagnosis ? Osteopenia, was told  to take extra vit d and calcium ~2015 Lipid screening- Lipid Profile, encouraged patient to consider fish oil supplementation. If her lipids are elevated enough to consider statin, will add to her regimen. Screening for diabetes mellitus- HgB A1c Encounter for vitamin deficiency screening- B12- Vitamin D (25 hydroxy) Screening for iron deficiency anemia- Ferritin Screening for HIV (human immunodeficiency virus)- HIV antibody (with reflex), patient amendable to screening today. Tobacco use disorder: Smoking cessation counseling 5 minutes duration today. Patient currently not ready to quit, but she is in contemplation. She currently only smokes a few cigarettes a week and is more of a social smoker in comparison to prior years. Discussed her trying to use nicotine gum and replacement of a cigarette during her social situations. Patches at this point would be too strong for her current smoking consumption.  Discussed with patient if she finds himself needing assistance, we could try an oral agent to help her with the cravings.  - Of note: Right lateral posterior rib tender to palpation: Discussed with patient if this worsens, or she would like to have this evaluated she needs to make an additional appointment. This seems to be a chronic issue that has not resolved. Requested records.  Return in about 1 year (around 04/05/2016) for CPE.  Follow up sooner if needed, or labs indicate need for sooner follow-up.  Howard Pouch, DO [place]

## 2015-04-06 NOTE — Patient Instructions (Signed)
Health Maintenance, Female Adopting a healthy lifestyle and getting preventive care can go a long way to promote health and wellness. Talk with your health care provider about what schedule of regular examinations is right for you. This is a good chance for you to check in with your provider about disease prevention and staying healthy. In between checkups, there are plenty of things you can do on your own. Experts have done a lot of research about which lifestyle changes and preventive measures are most likely to keep you healthy. Ask your health care provider for more information. WEIGHT AND DIET  Eat a healthy diet  Be sure to include plenty of vegetables, fruits, low-fat dairy products, and lean protein.  Do not eat a lot of foods high in solid fats, added sugars, or salt.  Get regular exercise. This is one of the most important things you can do for your health.  Most adults should exercise for at least 150 minutes each week. The exercise should increase your heart rate and make you sweat (moderate-intensity exercise).  Most adults should also do strengthening exercises at least twice a week. This is in addition to the moderate-intensity exercise.  Maintain a healthy weight  Body mass index (BMI) is a measurement that can be used to identify possible weight problems. It estimates body fat based on height and weight. Your health care provider can help determine your BMI and help you achieve or maintain a healthy weight.  For females 20 years of age and older:   A BMI below 18.5 is considered underweight.  A BMI of 18.5 to 24.9 is normal.  A BMI of 25 to 29.9 is considered overweight.  A BMI of 30 and above is considered obese.  Watch levels of cholesterol and blood lipids  You should start having your blood tested for lipids and cholesterol at 43 years of age, then have this test every 5 years.  You may need to have your cholesterol levels checked more often if:  Your lipid  or cholesterol levels are high.  You are older than 43 years of age.  You are at high risk for heart disease.  CANCER SCREENING   Lung Cancer  Lung cancer screening is recommended for adults 55-80 years old who are at high risk for lung cancer because of a history of smoking.  A yearly low-dose CT scan of the lungs is recommended for people who:  Currently smoke.  Have quit within the past 15 years.  Have at least a 30-pack-year history of smoking. A pack year is smoking an average of one pack of cigarettes a day for 1 year.  Yearly screening should continue until it has been 15 years since you quit.  Yearly screening should stop if you develop a health problem that would prevent you from having lung cancer treatment.  Breast Cancer  Practice breast self-awareness. This means understanding how your breasts normally appear and feel.  It also means doing regular breast self-exams. Let your health care provider know about any changes, no matter how small.  If you are in your 20s or 30s, you should have a clinical breast exam (CBE) by a health care provider every 1-3 years as part of a regular health exam.  If you are 40 or older, have a CBE every year. Also consider having a breast X-ray (mammogram) every year.  If you have a family history of breast cancer, talk to your health care provider about genetic screening.  If you   are at high risk for breast cancer, talk to your health care provider about having an MRI and a mammogram every year.  Breast cancer gene (BRCA) assessment is recommended for women who have family members with BRCA-related cancers. BRCA-related cancers include:  Breast.  Ovarian.  Tubal.  Peritoneal cancers.  Results of the assessment will determine the need for genetic counseling and BRCA1 and BRCA2 testing. Cervical Cancer Your health care provider may recommend that you be screened regularly for cancer of the pelvic organs (ovaries, uterus, and  vagina). This screening involves a pelvic examination, including checking for microscopic changes to the surface of your cervix (Pap test). You may be encouraged to have this screening done every 3 years, beginning at age 21.  For women ages 30-65, health care providers may recommend pelvic exams and Pap testing every 3 years, or they may recommend the Pap and pelvic exam, combined with testing for human papilloma virus (HPV), every 5 years. Some types of HPV increase your risk of cervical cancer. Testing for HPV may also be done on women of any age with unclear Pap test results.  Other health care providers may not recommend any screening for nonpregnant women who are considered low risk for pelvic cancer and who do not have symptoms. Ask your health care provider if a screening pelvic exam is right for you.  If you have had past treatment for cervical cancer or a condition that could lead to cancer, you need Pap tests and screening for cancer for at least 20 years after your treatment. If Pap tests have been discontinued, your risk factors (such as having a new sexual partner) need to be reassessed to determine if screening should resume. Some women have medical problems that increase the chance of getting cervical cancer. In these cases, your health care provider may recommend more frequent screening and Pap tests. Colorectal Cancer  This type of cancer can be detected and often prevented.  Routine colorectal cancer screening usually begins at 43 years of age and continues through 43 years of age.  Your health care provider may recommend screening at an earlier age if you have risk factors for colon cancer.  Your health care provider may also recommend using home test kits to check for hidden blood in the stool.  A small camera at the end of a tube can be used to examine your colon directly (sigmoidoscopy or colonoscopy). This is done to check for the earliest forms of colorectal  cancer.  Routine screening usually begins at age 50.  Direct examination of the colon should be repeated every 5-10 years through 43 years of age. However, you may need to be screened more often if early forms of precancerous polyps or small growths are found. Skin Cancer  Check your skin from head to toe regularly.  Tell your health care provider about any new moles or changes in moles, especially if there is a change in a mole's shape or color.  Also tell your health care provider if you have a mole that is larger than the size of a pencil eraser.  Always use sunscreen. Apply sunscreen liberally and repeatedly throughout the day.  Protect yourself by wearing long sleeves, pants, a wide-brimmed hat, and sunglasses whenever you are outside. HEART DISEASE, DIABETES, AND HIGH BLOOD PRESSURE   High blood pressure causes heart disease and increases the risk of stroke. High blood pressure is more likely to develop in:  People who have blood pressure in the high end   of the normal range (130-139/85-89 mm Hg).  People who are overweight or obese.  People who are African American.  If you are 38-23 years of age, have your blood pressure checked every 3-5 years. If you are 61 years of age or older, have your blood pressure checked every year. You should have your blood pressure measured twice--once when you are at a hospital or clinic, and once when you are not at a hospital or clinic. Record the average of the two measurements. To check your blood pressure when you are not at a hospital or clinic, you can use:  An automated blood pressure machine at a pharmacy.  A home blood pressure monitor.  If you are between 45 years and 39 years old, ask your health care provider if you should take aspirin to prevent strokes.  Have regular diabetes screenings. This involves taking a blood sample to check your fasting blood sugar level.  If you are at a normal weight and have a low risk for diabetes,  have this test once every three years after 43 years of age.  If you are overweight and have a high risk for diabetes, consider being tested at a younger age or more often. PREVENTING INFECTION  Hepatitis B  If you have a higher risk for hepatitis B, you should be screened for this virus. You are considered at high risk for hepatitis B if:  You were born in a country where hepatitis B is common. Ask your health care provider which countries are considered high risk.  Your parents were born in a high-risk country, and you have not been immunized against hepatitis B (hepatitis B vaccine).  You have HIV or AIDS.  You use needles to inject street drugs.  You live with someone who has hepatitis B.  You have had sex with someone who has hepatitis B.  You get hemodialysis treatment.  You take certain medicines for conditions, including cancer, organ transplantation, and autoimmune conditions. Hepatitis C  Blood testing is recommended for:  Everyone born from [birth year] through 1963.  Anyone with known risk factors for hepatitis C. Sexually transmitted infections (STIs)  You should be screened for sexually transmitted infections (STIs) including gonorrhea and chlamydia if:  You are sexually active and are younger than 43 years of age.  You are older than 43 years of age and your health care provider tells you that you are at risk for this type of infection.  Your sexual activity has changed since you were last screened and you are at an increased risk for chlamydia or gonorrhea. Ask your health care provider if you are at risk.  If you do not have HIV, but are at risk, it may be recommended that you take a prescription medicine daily to prevent HIV infection. This is called pre-exposure prophylaxis (PrEP). You are considered at risk if:  You are sexually active and do not regularly use condoms or know the HIV status of your partner(s).  You take drugs by injection.  You are sexually  active with a partner who has HIV. Talk with your health care provider about whether you are at high risk of being infected with HIV. If you choose to begin PrEP, you should first be tested for HIV. You should then be tested every 3 months for as long as you are taking PrEP.  PREGNANCY   If you are premenopausal and you may become pregnant, ask your health care provider about preconception counseling.  If you may  become pregnant, take 400 to 800 micrograms (mcg) of folic acid every day.  If you want to prevent pregnancy, talk to your health care provider about birth control (contraception). OSTEOPOROSIS AND MENOPAUSE   Osteoporosis is a disease in which the bones lose minerals and strength with aging. This can result in serious bone fractures. Your risk for osteoporosis can be identified using a bone density scan.  If you are 61 years of age or older, or if you are at risk for osteoporosis and fractures, ask your health care provider if you should be screened.  Ask your health care provider whether you should take a calcium or vitamin D supplement to lower your risk for osteoporosis.  Menopause may have certain physical symptoms and risks.  Hormone replacement therapy may reduce some of these symptoms and risks. Talk to your health care provider about whether hormone replacement therapy is right for you.  HOME CARE INSTRUCTIONS   Schedule regular health, dental, and eye exams.  Stay current with your immunizations.   Do not use any tobacco products including cigarettes, chewing tobacco, or electronic cigarettes.  If you are pregnant, do not drink alcohol.  If you are breastfeeding, limit how much and how often you drink alcohol.  Limit alcohol intake to no more than 1 drink per day for nonpregnant women. One drink equals 12 ounces of beer, 5 ounces of wine, or 1 ounces of hard liquor.  Do not use street drugs.  Do not share needles.  Ask your health care provider for help if  you need support or information about quitting drugs.  Tell your health care provider if you often feel depressed.  Tell your health care provider if you have ever been abused or do not feel safe at home.   This information is not intended to replace advice given to you by your health care provider. Make sure you discuss any questions you have with your health care provider.   Document Released: 08/26/2010 Document Revised: 03/03/2014 Document Reviewed: 01/12/2013 Elsevier Interactive Patient Education [employer].

## 2015-04-07 LAB — HIV ANTIBODY (ROUTINE TESTING W REFLEX): HIV: NONREACTIVE

## 2015-04-09 ENCOUNTER — Telehealth: Payer: Self-pay | Admitting: Family Medicine

## 2015-04-09 DIAGNOSIS — E559 Vitamin D deficiency, unspecified: Secondary | ICD-10-CM

## 2015-04-09 DIAGNOSIS — E785 Hyperlipidemia, unspecified: Secondary | ICD-10-CM | POA: Insufficient documentation

## 2015-04-09 MED ORDER — ATORVASTATIN CALCIUM 40 MG PO TABS: 40.0000 mg | ORAL_TABLET | Freq: Every day | ORAL | Status: DC

## 2015-04-09 MED ORDER — CHOLECALCIFEROL 1.25 MG (50000 UT) PO CAPS: 50000.0000 [IU] | ORAL_CAPSULE | ORAL | Status: DC

## 2015-04-09 NOTE — Telephone Encounter (Signed)
Spoke with patient reviewed information and instructions Patient verbalized understanding of all instructions. 

## 2015-04-09 NOTE — Telephone Encounter (Signed)
Please call pt: - Her vit. D is rather low at 24. I have started a 12 week supplement for her (1x weekly) after supplement completed will need to re-test. She should be encouraged to take OTC 800 u daily after supplement.  - Her cholesterol is elevated, with her current experience with chest pain and her family history of cardiac disease in her father,  I would recommend a statin to help lower cholesterol and provide CV protection. I have called this in for her to start. (Total 256, LDL 190) - F/U 3-4 months on cholesterol with CMP. - Other labs normal  F/u 3-4 months with Vit D and CMP prior if able (future orders placed)

## 2015-04-10 ENCOUNTER — Ambulatory Visit (INDEPENDENT_AMBULATORY_CARE_PROVIDER_SITE_OTHER): Payer: BLUE CROSS/BLUE SHIELD

## 2015-04-10 DIAGNOSIS — R002 Palpitations: Secondary | ICD-10-CM | POA: Diagnosis not present

## 2015-04-11 ENCOUNTER — Telehealth (HOSPITAL_COMMUNITY): Payer: Self-pay | Admitting: *Deleted

## 2015-04-11 NOTE — Telephone Encounter (Signed)
Patient given detailed instructions per Myocardial Perfusion Study Information Sheet for the test on 04/13/15 at 1130. Patient notified to arrive 15 minutes early and that it is imperative to arrive on time for appointment to keep from having the test rescheduled.  If you need to cancel or reschedule your appointment, please call the office within 24 hours of your appointment. Failure to do so may result in a cancellation of your appointment, and a $50 no show fee. Patient verbalized understanding.Camielle Sizer, Ranae Palms

## 2015-04-13 ENCOUNTER — Ambulatory Visit (HOSPITAL_COMMUNITY): Payer: BLUE CROSS/BLUE SHIELD | Attending: Cardiology

## 2015-04-13 DIAGNOSIS — I10 Essential (primary) hypertension: Secondary | ICD-10-CM | POA: Insufficient documentation

## 2015-04-13 DIAGNOSIS — R002 Palpitations: Secondary | ICD-10-CM | POA: Diagnosis not present

## 2015-04-13 DIAGNOSIS — I4510 Unspecified right bundle-branch block: Secondary | ICD-10-CM | POA: Diagnosis not present

## 2015-04-13 DIAGNOSIS — R079 Chest pain, unspecified: Secondary | ICD-10-CM | POA: Diagnosis present

## 2015-04-13 LAB — MYOCARDIAL PERFUSION IMAGING
CHL CUP NUCLEAR SDS: 1
CHL CUP NUCLEAR SRS: 4
CHL CUP RESTING HR STRESS: 72 {beats}/min
CSEPED: 10 min
CSEPEDS: 30 s
CSEPEW: 12.5 METS
LV dias vol: 100 mL
LV sys vol: 48 mL
MPHR: 178 {beats}/min
NUC STRESS TID: 0.85
Peak HR: 153 {beats}/min
Percent HR: 85 %
RATE: 0.23
SSS: 5

## 2015-04-13 MED ORDER — TECHNETIUM TC 99M SESTAMIBI GENERIC - CARDIOLITE
10.6000 | Freq: Once | INTRAVENOUS | Status: AC | PRN
  Administered 2015-04-13: 11 via INTRAVENOUS

## 2015-04-13 MED ORDER — TECHNETIUM TC 99M SESTAMIBI GENERIC - CARDIOLITE
31.6000 | Freq: Once | INTRAVENOUS | Status: AC | PRN
  Administered 2015-04-13: 32 via INTRAVENOUS

## 2015-04-22 NOTE — Progress Notes (Signed)
Electrophysiology Office Note   Date:  04/23/2015   ID:  Tanya Kelley, DOB 1970-12-18, MRN [id number]  PCP:  Howard Pouch, DO   Primary Electrophysiologist:  L Katrena Stehlin Meredith Leeds, MD    Chief Complaint  Patient presents with  . Follow-up     History of Present Illness: Tanya Kelley is a 45 y.o. female who presents today for electrophysiology evaluation.   She had chest pain, nausea, SOB and diaphoresis for one month.  She also has noticed heart racing with HR of 150.  She had a stress myoview which was low risk and a 48 hour monitor showing sinus rhythm with sinus tachycardia.   She continues to have symptoms of palpitations and chest pain. She says that her symptoms occur  At all times of the day. She says that they wake her up at night at times, and also occur when she is working. She continues to have chest discomfort in the center of her chest which radiates to her arm and shoulder.  This occurs mainly when she's having the palpitations.   Today, she denies symptoms of orthopnea, PND, lower extremity edema, claudication, dizziness, presyncope, syncope, bleeding, or neurologic sequela. The patient is tolerating medications without difficulties and is otherwise without complaint today.    Past Medical History  Diagnosis Date  . Barrett esophagus 2013  . Chest pain   . Shoulder pain   . Nausea   . Hot flashes   . Jaw pain   . Fatigue   . ABDOMINAL BLOATING 04/10/2009    Qualifier: Diagnosis of  By: Nelson-Smith CMA (AAMA), Dottie    . B12 DEFICIENCY 04/12/2009    Qualifier: Diagnosis of  By: Surface RN, Butch Penny    . ANEMIA, IRON DEFICIENCY 04/12/2009    Qualifier: Diagnosis of  By: Surface RN, Butch Penny    . COPD 04/10/2009    Qualifier: Diagnosis of  By: Nelson-Smith CMA (AAMA), Dottie    . CONSTIPATION 04/10/2009    Qualifier: Diagnosis of  By: Nelson-Smith CMA (AAMA), Dottie    . MELENA 04/10/2009    Qualifier: Diagnosis of  By: Nelson-Smith CMA (AAMA), Dottie    .  SACROILIITIS, RIGHT 06/26/2009    Qualifier: Diagnosis of  By: Lorelei Pont MD, Frederico Hamman    . CERVICAL RADICULOPATHY, RIGHT 06/26/2009    Qualifier: Diagnosis of  By: Lorelei Pont MD, Frederico Hamman    . WEIGHT LOSS 04/10/2009    Qualifier: Diagnosis of  By: Nelson-Smith CMA (AAMA), Dottie    . Diarrhea 04/10/2009    Qualifier: Diagnosis of  By: Nelson-Smith CMA (AAMA), Dottie    . Abdominal pain, unspecified site 04/10/2009    Qualifier: Diagnosis of  By: Nelson-Smith CMA (AAMA), Dottie    . Colon polyps    Past Surgical History  Procedure Laterality Date  . Cholecystectomy    . Tubal ligation       Current Outpatient Prescriptions  Medication Sig Dispense Refill  . aspirin 81 MG tablet Take 81 mg by mouth daily.    . Cholecalciferol 50000 units capsule Take 1 capsule (50,000 Units total) by mouth once a week. 12 capsule 0  . omeprazole (PRILOSEC) 40 MG capsule Take 40 mg by mouth daily.    Marland Kitchen atorvastatin (LIPITOR) 40 MG tablet Take 1 tablet (40 mg total) by mouth daily. (Patient taking differently: Take 40 mg by mouth daily. ) 30 tablet 2   No current facility-administered medications for this visit.    Allergies:   Review of patient's  allergies indicates no known allergies.   Social History:  The patient  reports that she has been smoking Cigarettes.  She has a 6.75 pack-year smoking history. She has never used smokeless tobacco. She reports that she drinks alcohol. She reports that she does not use illicit drugs.   Family History:  The patient's family history includes Colon cancer (age of onset: 67) in her paternal grandmother; Early death (age of onset: 46) in her paternal grandfather; Healthy in her brother and mother; Heart attack in her paternal grandfather; Heart disease in her paternal grandfather; Hypertension in her father.    ROS:  Please see the history of present illness.   Otherwise, review of systems is positive for chest pain, palpitations, shortness of breath.   All other systems are  reviewed and negative.    PHYSICAL EXAM: VS:  BP 110/80 mmHg  Pulse 92  Ht 5\' 8"  (1.727 m)  Wt 175 lb 9.6 oz (79.652 kg)  BMI 26.71 kg/m2 , BMI Body mass index is 26.71 kg/(m^2). GEN: Well nourished, well developed, in no acute distress HEENT: normal Neck: no JVD, carotid bruits, or masses Cardiac: RRR; no murmurs, rubs, or gallops,no edema  Respiratory:  clear to auscultation bilaterally, normal work of breathing GI: soft, nontender, nondistended, + BS MS: no deformity or atrophy Skin: warm and dry Neuro:  Strength and sensation are intact Psych: euthymic mood, full affect  EKG:  EKG is not ordered today.  Recent Labs: 03/29/2015: BUN 13; Creat 0.78; Hemoglobin 13.1; Platelets 202; Potassium 3.9; Sodium 140; TSH 1.135    Lipid Panel     Component Value Date/Time   CHOL 256* 04/06/2015 1039   TRIG 75.0 04/06/2015 1039   HDL 51.30 04/06/2015 1039   CHOLHDL 5 04/06/2015 1039   VLDL 15.0 04/06/2015 1039   LDLCALC 190* 04/06/2015 1039     Wt Readings from Last 3 Encounters:  04/23/15 175 lb 9.6 oz (79.652 kg)  04/06/15 174 lb (78.926 kg)  04/04/15 172 lb 12.8 oz (78.382 kg)      Other studies Reviewed: Additional studies/ records that were reviewed today include: Myoview 04/13/15  Review of the above records today demonstrates:   Nuclear stress EF: 52%.  There was no ST segment deviation noted during stress.  The study is normal.  This is a low risk study.  The left ventricular ejection fraction is mildly decreased (45-54%).  48 hour monitor Average HR 98 Minimum HR 57 Maximum HR 166 Rhyhms: sinus rhythm with sinus tachycardia. Sinus tachycardia associated with fluttering and chest pain.  she had the patient ASSESSMENT AND PLAN:  1.  Palpitations:  48 hour monitor showed mainly sinus tachycardia and sinus rhythm with an average heart rate of 98. She did have symptoms of palpitations fluttering in chest pain at the time of sinus tachycardia. Due to her  symptoms, we'll start her on metoprolol 25 mg twice a day. This may also help with her chest pain.    Current medicines are reviewed at length with the patient today.   The patient does not have concerns regarding her medicines.  The following changes were made today:  Metoprolol 25 mg BID  Labs/ tests ordered today include:  No orders of the defined types were placed in this encounter.     Disposition:   FU with Anda Sobotta 3 months  Signed, Danen Lapaglia Meredith Leeds, MD  04/23/2015 10:10 AM     [facility] [address] [place] [address] [phone] (  office) [phone] (fax)  Mr. Louretta Shorten still here procedure

## 2015-04-23 ENCOUNTER — Ambulatory Visit: Payer: Self-pay | Admitting: Family

## 2015-04-23 ENCOUNTER — Encounter: Payer: Self-pay | Admitting: Cardiology

## 2015-04-23 ENCOUNTER — Ambulatory Visit (INDEPENDENT_AMBULATORY_CARE_PROVIDER_SITE_OTHER): Payer: BLUE CROSS/BLUE SHIELD | Admitting: Cardiology

## 2015-04-23 VITALS — BP 110/80 | HR 92 | Ht 68.0 in | Wt 175.6 lb

## 2015-04-23 DIAGNOSIS — R000 Tachycardia, unspecified: Secondary | ICD-10-CM

## 2015-04-23 MED ORDER — METOPROLOL TARTRATE 25 MG PO TABS: 25.0000 mg | ORAL_TABLET | Freq: Two times a day (BID) | ORAL | Status: DC

## 2015-04-23 NOTE — Patient Instructions (Addendum)
Medication Instructions:  Your physician has recommended you make the following change in your medication:  1) START Metoprolol Tartrate 25 mg twice a day  Labwork: None ordered  Testing/Procedures: None ordered  Follow-Up: Your physician recommends that you schedule a follow-up appointment in: 3 months with Dr. Curt Bears.  If you need a refill on your cardiac medications before your next appointment, please call your pharmacy.  Thank you for choosing [facility]!!   Trinidad Curet, RN [phone]

## 2015-04-30 ENCOUNTER — Encounter: Payer: Self-pay | Admitting: Gastroenterology

## 2015-05-18 ENCOUNTER — Encounter: Payer: Self-pay | Admitting: Family Medicine

## 2015-05-18 ENCOUNTER — Ambulatory Visit (INDEPENDENT_AMBULATORY_CARE_PROVIDER_SITE_OTHER): Payer: BLUE CROSS/BLUE SHIELD | Admitting: Family Medicine

## 2015-05-18 ENCOUNTER — Encounter: Payer: Self-pay | Admitting: Gastroenterology

## 2015-05-18 ENCOUNTER — Other Ambulatory Visit (INDEPENDENT_AMBULATORY_CARE_PROVIDER_SITE_OTHER): Payer: BLUE CROSS/BLUE SHIELD

## 2015-05-18 ENCOUNTER — Ambulatory Visit (INDEPENDENT_AMBULATORY_CARE_PROVIDER_SITE_OTHER): Payer: BLUE CROSS/BLUE SHIELD | Admitting: Gastroenterology

## 2015-05-18 VITALS — BP 102/74 | HR 97 | Temp 98.1°F | Resp 20 | Wt 176.2 lb

## 2015-05-18 VITALS — BP 106/70 | HR 100 | Ht 67.5 in | Wt 176.4 lb

## 2015-05-18 DIAGNOSIS — J0100 Acute maxillary sinusitis, unspecified: Secondary | ICD-10-CM

## 2015-05-18 DIAGNOSIS — B001 Herpesviral vesicular dermatitis: Secondary | ICD-10-CM | POA: Diagnosis not present

## 2015-05-18 DIAGNOSIS — R197 Diarrhea, unspecified: Secondary | ICD-10-CM

## 2015-05-18 DIAGNOSIS — R109 Unspecified abdominal pain: Secondary | ICD-10-CM | POA: Diagnosis not present

## 2015-05-18 DIAGNOSIS — I889 Nonspecific lymphadenitis, unspecified: Secondary | ICD-10-CM | POA: Diagnosis not present

## 2015-05-18 DIAGNOSIS — Z86010 Personal history of colonic polyps: Secondary | ICD-10-CM

## 2015-05-18 DIAGNOSIS — K219 Gastro-esophageal reflux disease without esophagitis: Secondary | ICD-10-CM

## 2015-05-18 DIAGNOSIS — K2270 Barrett's esophagus without dysplasia: Secondary | ICD-10-CM

## 2015-05-18 LAB — HEPATIC FUNCTION PANEL
ALBUMIN: 4.5 g/dL (ref 3.5–5.2)
ALT: 30 U/L (ref 0–35)
AST: 19 U/L (ref 0–37)
Alkaline Phosphatase: 69 U/L (ref 39–117)
BILIRUBIN TOTAL: 0.3 mg/dL (ref 0.2–1.2)
Bilirubin, Direct: 0 mg/dL (ref 0.0–0.3)
Total Protein: 7.6 g/dL (ref 6.0–8.3)

## 2015-05-18 MED ORDER — AMOXICILLIN-POT CLAVULANATE 875-125 MG PO TABS: 1.0000 | ORAL_TABLET | Freq: Two times a day (BID) | ORAL | Status: DC

## 2015-05-18 MED ORDER — COLESTIPOL HCL 1 G PO TABS: 1.0000 g | ORAL_TABLET | Freq: Two times a day (BID) | ORAL | Status: DC

## 2015-05-18 MED ORDER — OMEPRAZOLE 40 MG PO CPDR: 40.0000 mg | DELAYED_RELEASE_CAPSULE | Freq: Two times a day (BID) | ORAL | Status: DC

## 2015-05-18 MED ORDER — PREDNISONE 50 MG PO TABS: 50.0000 mg | ORAL_TABLET | Freq: Every day | ORAL | Status: DC

## 2015-05-18 MED ORDER — VALACYCLOVIR HCL 1 G PO TABS: ORAL_TABLET | ORAL | Status: DC

## 2015-05-18 NOTE — Progress Notes (Signed)
Patient ID: Tanya Kelley, female   DOB: 1970-10-01, 45 y.o.   MRN: [id number]    Tanya Kelley , 05-03-1970, 45 y.o., female MRN: [id number]  CC: sinus pain Subjective: Pt presents for an acute OV with complaints of sinus pain of 5 days duration. Associated symptoms include ear fullness, teeth pain, nasal congestion, headache, cough, chills  and PND. Pt has tried mucinex, tylenol, flonase to ease their symptoms.  No asthma history. Immunizations not UTD.  No Known Allergies Social History  Substance Use Topics  . Smoking status: Light Tobacco Smoker -- 0.25 packs/day for 27 years    Types: Cigarettes  . Smokeless tobacco: Never Used  . Alcohol Use: Yes     Comment: occassional    Past Medical History  Diagnosis Date  . Barrett esophagus 2013  . Chest pain   . Shoulder pain   . Nausea   . Hot flashes   . Jaw pain   . Fatigue   . ABDOMINAL BLOATING 04/10/2009    Qualifier: Diagnosis of  By: Tanya Kelley    . B12 DEFICIENCY 04/12/2009    Qualifier: Diagnosis of  By: Tanya Kelley    . ANEMIA, IRON DEFICIENCY 04/12/2009    Qualifier: Diagnosis of  By: Tanya Kelley    . COPD 04/10/2009    Qualifier: Diagnosis of  By: Tanya Kelley    . CONSTIPATION 04/10/2009    Qualifier: Diagnosis of  By: Tanya Kelley    . MELENA 04/10/2009    Qualifier: Diagnosis of  By: Tanya Kelley    . SACROILIITIS, RIGHT 06/26/2009    Qualifier: Diagnosis of  By: Tanya Kelley    . CERVICAL RADICULOPATHY, RIGHT 06/26/2009    Qualifier: Diagnosis of  By: Tanya Kelley    . WEIGHT LOSS 04/10/2009    Qualifier: Diagnosis of  By: Tanya Kelley    . Diarrhea 04/10/2009    Qualifier: Diagnosis of  By: Tanya Kelley    . Abdominal pain, unspecified site 04/10/2009    Qualifier: Diagnosis of  By: Tanya Kelley    . Colon polyps    Past Surgical History    Procedure Laterality Date  . Cholecystectomy    . Tubal ligation     Family History  Problem Relation Age of Onset  . Hypertension Father   . Healthy Mother   . Healthy Brother   . Heart attack Paternal Grandfather   . Heart disease Paternal Grandfather   . Early death Paternal Grandfather 57    MI  . Colon cancer Paternal Grandmother 20    alive     Medication List       This list is accurate as of: 05/18/15 11:33 AM.  Always use your most recent med list.               aspirin 81 MG tablet  Take 81 mg by mouth daily.     atorvastatin 40 MG tablet  Commonly known as:  LIPITOR  Take 1 tablet (40 mg total) by mouth daily.     Cholecalciferol 50000 units capsule  Take 1 capsule (50,000 Units total) by mouth once a week.     metoprolol tartrate 25 MG tablet  Commonly known as:  LOPRESSOR  Take 1 tablet (25 mg total) by mouth 2 (two) times daily.     omeprazole 40 MG capsule  Commonly known as:  PRILOSEC  Take 40 mg by mouth daily.         ROS: Negative, with the exception of above mentioned in HPI   Objective:  BP 102/74 mmHg  Pulse 97  Temp(Src) 98.1 F (36.7 C)  Resp 20  Wt 176 lb 4 oz (79.946 kg)  SpO2 99% Body mass index is 26.8 kg/(m^2). Gen: Afebrile. No acute distress. Nontoxic in appearance well developed well nourished female.  HENT: AT. [place]. Bilateral TM visualized, with air fluid levels bilateral R>L. MMM, no oral lesions. Bilateral nares with erythmea, swelling and dried blood. Throat without erythema or exudates. PND present, no cough or hoarseness on exam. TTP max sinus.  Eyes:Pupils Equal Round Reactive to light, Extraocular movements intact,  Conjunctiva without redness, discharge or icterus. Neck/lymp/endocrine: Supple, tender ant cervical lymphadenopathy present CV: RRR Chest: CTAB, no wheeze or crackles. Good air movement, normal resp effort.  Abd: Soft.  NTND. BS present Neuro: Normal gait. PERLA. EOMi. Alert. Oriented x3    Assessment/Plan: Tanya Kelley is a 45 y.o. female present for acute OV for  Acute maxillary sinusitis, recurrence not specified/Lymphadenitis - Rest and hydrate.Continue Mucinex and flonase. Or robitussin DM. - amoxicillin-clavulanate (AUGMENTIN) 875-125 MG tablet; Take 1 tablet by mouth 2 (two) times daily.  Dispense: 20 tablet; Refill: 0 - predniSONE (DELTASONE) 50 MG tablet; Take 1 tablet (50 mg total) by mouth daily with breakfast.  Dispense: 5 tablet; Refill: 0   Recurrent cold sores - valACYclovir (VALTREX) 1000 MG tablet; Take 2 tabs with onset of symptoms and then 2 tabs in 12 hours PRN for cold sore  Dispense: 20 tablet; Refill: 3  electronically signed by:  Tanya Kelley  [place]

## 2015-05-18 NOTE — Progress Notes (Signed)
HPI :  45 y/o female new patient evaluation, previously seen by Dr. Sharlett Iles. She has not been seen here in several years.   She has longstanding GERD, main symptom is pyrosis which bothers her significantly at this time. She takes 40mg  omeprazole daily as well as TUMS frequently for this. She has breakthrough of heartburn daily despite this. TUMS can help. She has a lot of symptoms at night which bother her when she lies down to sleep. She eats dinner around 5-630, she goes to bed around 930-10 PM. No snacks prior to bedtime. She sleeps with a lot of pillows under her head already. She has no dysphagia at baseline, she can feel this rarely if she eats too fast. She has nausea periodically, but no vomiting. Patient reports she has epigastric discomfort which bothers her in the morning when she wakes up, usually prior to eating, and also in her RUQ as outlined below. When she eats she does not think there is any relation to the pain. The pain is intermittent feels it in the lower sternum. Mostly in the morning, does not bother her much at night. It doesn't feel like heartburn. It usually goes away on its own, eating or drinking does not make it worse. Eating makes the nausea go away. Pain lasts a few minutes at a time. Weight is stable.   She states she has had a diagnosis of Barrett's esophagus, diagnosed in 2015 endoscopy by [facility]. We do not have these records available today.   She has had multiple medication changes recently. She is on Augmentin which just started today for a sinus infection, as well as prednisone for sinuses. She denies any blood in her stools. She has had a prior cholecystectomy in 2015 for chronic RUQ pain which did not change the pain at all. The pain preceeded the gallbladder being removed. The pain is there constantly, never goes away. She is not sure if anything makes it better or worse. She states it is a dull knawing pain. She has also had chronic diarrhea  postprandially since her GB was removed and is wondering if it is related to her gallbladder being removed. The pain has been ongoing for several years per patient. She has had prior negative celiac testing.  She also had a follow up colonoscopy in 2015 for her history of polyps removed in 2011, and she had 2 additional polyps removed. She was told to return in 2-3 years.   Prior endoscopy on file: EGD 03/2009 - normal esophagus, normal stomach, normal duodenum Colonoscopy 03/2009 - hepatic flexure polyp removed, no pathology noted Negative prior celiac panel  Past Medical History  Diagnosis Date  . Barrett esophagus 2013  . Chest pain   . Shoulder pain   . Nausea   . Hot flashes   . Jaw pain   . Fatigue   . ABDOMINAL BLOATING 04/10/2009    Qualifier: Diagnosis of  By: Nelson-Smith CMA (AAMA), Dottie    . B12 DEFICIENCY 04/12/2009    Qualifier: Diagnosis of  By: Surface RN, Butch Penny    . ANEMIA, IRON DEFICIENCY 04/12/2009    Qualifier: Diagnosis of  By: Surface RN, Butch Penny    . COPD 04/10/2009    Qualifier: Diagnosis of  By: Nelson-Smith CMA (AAMA), Dottie    . CONSTIPATION 04/10/2009    Qualifier: Diagnosis of  By: Nelson-Smith CMA (AAMA), Dottie    . MELENA 04/10/2009    Qualifier: Diagnosis of  By: Nelson-Smith CMA (AAMA), Dottie    .  SACROILIITIS, RIGHT 06/26/2009    Qualifier: Diagnosis of  By: Lorelei Pont MD, Frederico Hamman    . CERVICAL RADICULOPATHY, RIGHT 06/26/2009    Qualifier: Diagnosis of  By: Lorelei Pont MD, Frederico Hamman    . WEIGHT LOSS 04/10/2009    Qualifier: Diagnosis of  By: Nelson-Smith CMA (AAMA), Dottie    . Diarrhea 04/10/2009    Qualifier: Diagnosis of  By: Nelson-Smith CMA (AAMA), Dottie    . Abdominal pain, unspecified site 04/10/2009    Qualifier: Diagnosis of  By: Nelson-Smith CMA (AAMA), Dottie    . Colon polyps      Past Surgical History  Procedure Laterality Date  . Cholecystectomy    . Tubal ligation    . Foot surgery Bilateral    Family History  Problem Relation Age of  Onset  . Hypertension Father   . Healthy Mother   . Healthy Brother   . Heart attack Paternal Grandfather   . Heart disease Paternal Grandfather   . Early death Paternal Grandfather 29    MI  . Colon cancer Paternal Grandmother 73    alive  . Clotting disorder Father   . Clotting disorder Paternal Grandmother   . Colon polyps Father   . Colon polyps Paternal Aunt     x 2   Social History  Substance Use Topics  . Smoking status: Light Tobacco Smoker -- 0.25 packs/day for 27 years    Types: Cigarettes  . Smokeless tobacco: Never Used  . Alcohol Use: Yes     Comment: occassional    Current Outpatient Prescriptions  Medication Sig Dispense Refill  . amoxicillin-clavulanate (AUGMENTIN) 875-125 MG tablet Take 1 tablet by mouth 2 (two) times daily. 20 tablet 0  . aspirin 81 MG tablet Take 81 mg by mouth daily.    Marland Kitchen atorvastatin (LIPITOR) 40 MG tablet Take 1 tablet (40 mg total) by mouth daily. (Patient taking differently: Take 40 mg by mouth daily. ) 30 tablet 2  . Cholecalciferol 50000 units capsule Take 1 capsule (50,000 Units total) by mouth once a week. 12 capsule 0  . metoprolol tartrate (LOPRESSOR) 25 MG tablet Take 1 tablet (25 mg total) by mouth 2 (two) times daily. 60 tablet 3  . omeprazole (PRILOSEC) 40 MG capsule Take 40 mg by mouth daily.    . predniSONE (DELTASONE) 50 MG tablet Take 1 tablet (50 mg total) by mouth daily with breakfast. 5 tablet 0  . valACYclovir (VALTREX) 1000 MG tablet Take 2 tabs with onset of symptoms and then 2 tabs in 12 hours PRN for cold sore 20 tablet 3   No current facility-administered medications for this visit.   No Known Allergies   Review of Systems: All systems reviewed and negative except where noted in HPI.   Lab Results  Component Value Date   WBC 6.9 03/29/2015   HGB 13.1 03/29/2015   HCT 38.8 03/29/2015   MCV 89.2 03/29/2015   PLT 202 03/29/2015    Lab Results  Component Value Date   ALT 20 11/21/2009   AST 22  11/21/2009   ALKPHOS 59 11/21/2009   BILITOT 0.6 11/21/2009    Lab Results  Component Value Date   CREATININE 0.78 03/29/2015   BUN 13 03/29/2015   NA 140 03/29/2015   K 3.9 03/29/2015   CL 105 03/29/2015   CO2 24 03/29/2015     Physical Exam: BP 106/70 mmHg  Pulse 100  Ht 5' 7.5" (1.715 m)  Wt 176 lb 6 oz (80.003 kg)  BMI 27.20 kg/m2 Constitutional: Pleasant,well-developed, female in no acute distress. HEENT: Normocephalic and atraumatic. Conjunctivae are normal. No scleral icterus. Neck supple.  Cardiovascular: Normal rate, regular rhythm.  Pulmonary/chest: Effort normal and breath sounds normal. No wheezing, rales or rhonchi. Abdominal: Soft, nondistended, mild TTP to the RUQ but negative [place]. Bowel sounds active throughout. There are no masses palpable. No hepatomegaly. Extremities: no edema Lymphadenopathy: No cervical adenopathy noted. Neurological: Alert and oriented to person place and time. Skin: Skin is warm and dry. No rashes noted. Psychiatric: Normal mood and affect. Behavior is normal.   ASSESSMENT AND PLAN: GERD Abdominal pain Diarrhea History of Barrett's Colon poltps  45 y/o female seen in consultation to address the following issues:  GERD / Barrett's esophagus - prior EGD normal here in 2011 however reports another EGD in 2015 showing Barrett's esophagus. I have asked her if we can obtain this records to clarify the findings. I counseled her the risk of malignancy is generally low with Barrett's, and surveillance, per national guidelines, is typically every 3-5 years. Once I review her records I will clarify when she is next due for surveillance. Otherwise regarding her GERD symptoms, I recommend she increase her omeprazole to 40mg  BID for now and see how she does. I counseled her on the risks of PPI use. The risks of long term PPIs with current data include increased risk for chronic kidney disease, increased risk of fracture, increased risk of C  diff, increased risk of pneumonia (short term usage), potentially increased risk of B12 / calcium deficiency, and rare risk of hypomagnesemia. Recent studies have also shown an association with increased risk of dementia and cardiovascular outcomes including stroke. These studies have showed an association between PPIs and several of these outcomes but no evidence of causality. The patient was counseled to use the lowest daily use of PPI needed to control symptoms. Renal function is currently normal and would monitor this periodically over time if on PPI  Abdominal pain - chronic RUQ pain, and epigastric pain - post cholecystectomy which did not help her RUQ pain at all. I would imagine given her prior workup she has likely had cross sectional imaging and will request these records from her prior GI. If she has not had prior imaging, we may consider a CT scan. That being said, based on her she describes her pain I suspect she may have abdominal wall pain / musculoskeletal pain driving her symptoms. Recommend a trial of capsaicin cream to see if this helps, and may consider pain management consultation for trigger point injection pending her course. I will check LFTs now to ensure normal. Her epigastric pain may represent esophagitis, we will see how she does on higher dose PPI.   Diarrhea - since post-cholecystectomy and is likely related. Will try colestid BID PRN to see if this helps. She can follow up as needed for this issue.   History of colon polyps - no path from polypectomy in 2011, however will obtain records from colonoscopy in 2015 to see when she is next due for surveillance.   [place] Cellar, MD [facility] Pager [phone]

## 2015-05-18 NOTE — Patient Instructions (Addendum)
Rest and hydrate. Prescribed Augmentin and prednisone.  Continue Mucinex and flonase. Or robitussin DM. Valtrex prescribed for fever blisters.    Sinusitis, Adult Sinusitis is redness, soreness, and inflammation of the paranasal sinuses. Paranasal sinuses are air pockets within the bones of your face. They are located beneath your eyes, in the middle of your forehead, and above your eyes. In healthy paranasal sinuses, mucus is able to drain out, and air is able to circulate through them by way of your nose. However, when your paranasal sinuses are inflamed, mucus and air can become trapped. This can allow bacteria and other germs to grow and cause infection. Sinusitis can develop quickly and last only a short time (acute) or continue over a long period (chronic). Sinusitis that lasts for more than 12 weeks is considered chronic. CAUSES Causes of sinusitis include:  Allergies.  Structural abnormalities, such as displacement of the cartilage that separates your nostrils (deviated septum), which can decrease the air flow through your nose and sinuses and affect sinus drainage.  Functional abnormalities, such as when the small hairs (cilia) that line your sinuses and help remove mucus do not work properly or are not present. SIGNS AND SYMPTOMS Symptoms of acute and chronic sinusitis are the same. The primary symptoms are pain and pressure around the affected sinuses. Other symptoms include:  Upper toothache.  Earache.  Headache.  Bad breath.  Decreased sense of smell and taste.  A cough, which worsens when you are lying flat.  Fatigue.  Fever.  Thick drainage from your nose, which often is green and may contain pus (purulent).  Swelling and warmth over the affected sinuses. DIAGNOSIS Your health care provider will perform a physical exam. During your exam, your health care provider may perform any of the following to help determine if you have acute sinusitis or chronic  sinusitis:  Look in your nose for signs of abnormal growths in your nostrils (nasal polyps).  Tap over the affected sinus to check for signs of infection.  View the inside of your sinuses using an imaging device that has a light attached (endoscope). If your health care provider suspects that you have chronic sinusitis, one or more of the following tests may be recommended:  Allergy tests.  Nasal culture. A sample of mucus is taken from your nose, sent to a lab, and screened for bacteria.  Nasal cytology. A sample of mucus is taken from your nose and examined by your health care provider to determine if your sinusitis is related to an allergy. TREATMENT Most cases of acute sinusitis are related to a viral infection and will resolve on their own within 10 days. Sometimes, medicines are prescribed to help relieve symptoms of both acute and chronic sinusitis. These may include pain medicines, decongestants, nasal steroid sprays, or saline sprays. However, for sinusitis related to a bacterial infection, your health care provider will prescribe antibiotic medicines. These are medicines that will help kill the bacteria causing the infection. Rarely, sinusitis is caused by a fungal infection. In these cases, your health care provider will prescribe antifungal medicine. For some cases of chronic sinusitis, surgery is needed. Generally, these are cases in which sinusitis recurs more than 3 times per year, despite other treatments. HOME CARE INSTRUCTIONS  Drink plenty of water. Water helps thin the mucus so your sinuses can drain more easily.  Use a humidifier.  Inhale steam 3-4 times a day (for example, sit in the bathroom with the shower running).  Apply a warm, moist  washcloth to your face 3-4 times a day, or as directed by your health care provider.  Use saline nasal sprays to help moisten and clean your sinuses.  Take medicines only as directed by your health care provider.  If you were  prescribed either an antibiotic or antifungal medicine, finish it all even if you start to feel better. SEEK IMMEDIATE MEDICAL CARE IF:  You have increasing pain or severe headaches.  You have nausea, vomiting, or drowsiness.  You have swelling around your face.  You have vision problems.  You have a stiff neck.  You have difficulty breathing.   This information is not intended to replace advice given to you by your health care provider. Make sure you discuss any questions you have with your health care provider.   Document Released: 02/10/2005 Document Revised: 03/03/2014 Document Reviewed: 02/25/2011 Elsevier Interactive Patient Education [employer].

## 2015-05-18 NOTE — Patient Instructions (Signed)
Your physician has requested that you go to the basement for the following lab work before leaving today:LFT's.  We have sent the following medications to your pharmacy for you to pick up at your convenience:colestipol one tablet by mouth twice daily and omeprazole one tablet by mouth twice daily.

## 2015-05-24 ENCOUNTER — Telehealth: Payer: Self-pay | Admitting: Gastroenterology

## 2015-05-24 DIAGNOSIS — K219 Gastro-esophageal reflux disease without esophagitis: Secondary | ICD-10-CM

## 2015-05-24 DIAGNOSIS — K2270 Barrett's esophagus without dysplasia: Secondary | ICD-10-CM

## 2015-05-24 NOTE — Telephone Encounter (Signed)
Patient given results and recommendations. Scheduled for EGD on 06/12/15 at 4:00 PM at [facility] and previsit on 05/25/15 at 2:00 PM.

## 2015-05-24 NOTE — Telephone Encounter (Signed)
Recall for colonoscopy in EPIC. Left a message for patient to call back.

## 2015-05-24 NOTE — Telephone Encounter (Signed)
Records received from prior GI workup and are as follows:  HIDA scan 07/02/2012 - normal GB EF EGD 06/29/2012 - 1cm segment of irregular z-line, biopsies c/w nondysplastic Barrett's, otherwise normal endoscopy Colonoscopy 06/29/2012 - normal colonoscopy without polyps, internal hemorrhoids noted  Rollene Fare can you please let this patient know I reviewed her prior records. She had no polyps on her last colonoscopy thus she should not be due for repeat colon cancer screening until 2024. Please place a recall for this.  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 Guidelines recommend surveillance endoscopy every 3-5 years for this issue. She has had worsening reflux symptoms recently and I will offer her a repeat EGD for this issue and for Barrett's surveillance given she is just about 3 years from her last exam, if you can offer her this and coordinate if she wishes to proceed. Thanks

## 2015-05-24 NOTE — Telephone Encounter (Signed)
Returning your call. Advised you would call again after your lunch break

## 2015-05-25 ENCOUNTER — Ambulatory Visit (AMBULATORY_SURGERY_CENTER): Payer: Self-pay | Admitting: *Deleted

## 2015-05-25 VITALS — Ht 67.5 in | Wt 176.0 lb

## 2015-05-25 DIAGNOSIS — K2270 Barrett's esophagus without dysplasia: Secondary | ICD-10-CM

## 2015-05-25 DIAGNOSIS — K219 Gastro-esophageal reflux disease without esophagitis: Secondary | ICD-10-CM

## 2015-05-25 NOTE — Progress Notes (Signed)
No egg or soy allergy  No anesthesia or intubation problems per pt  No diet medications taken   

## 2015-06-12 ENCOUNTER — Encounter: Payer: Self-pay | Admitting: Gastroenterology

## 2015-06-12 ENCOUNTER — Ambulatory Visit (AMBULATORY_SURGERY_CENTER): Payer: BLUE CROSS/BLUE SHIELD | Admitting: Gastroenterology

## 2015-06-12 VITALS — BP 112/67 | HR 71 | Temp 98.7°F | Resp 19 | Ht 67.0 in | Wt 176.0 lb

## 2015-06-12 DIAGNOSIS — K219 Gastro-esophageal reflux disease without esophagitis: Secondary | ICD-10-CM | POA: Diagnosis present

## 2015-06-12 DIAGNOSIS — K2270 Barrett's esophagus without dysplasia: Secondary | ICD-10-CM | POA: Diagnosis not present

## 2015-06-12 HISTORY — PX: UPPER GASTROINTESTINAL ENDOSCOPY: SHX188

## 2015-06-12 MED ORDER — SODIUM CHLORIDE 0.9 % IV SOLN
500.0000 mL | INTRAVENOUS | Status: DC
Start: 2015-06-12 — End: 2015-06-12

## 2015-06-12 NOTE — Progress Notes (Signed)
Stable to RR 

## 2015-06-12 NOTE — Patient Instructions (Signed)
YOU HAD AN ENDOSCOPIC PROCEDURE TODAY AT THE [facility]:   Refer to the procedure report that was given to you for any specific questions about what was found during the examination.  If the procedure report does not answer your questions, please call your gastroenterologist to clarify.  If you requested that your care partner not be given the details of your procedure findings, then the procedure report has been included in a sealed envelope for you to review at your convenience later.  YOU SHOULD EXPECT: Some feelings of bloating in the abdomen. Passage of more gas than usual.  Walking can help get rid of the air that was put into your GI tract during the procedure and reduce the bloating. If you had a lower endoscopy (such as a colonoscopy or flexible sigmoidoscopy) you may notice spotting of blood in your stool or on the toilet paper. If you underwent a bowel prep for your procedure, you may not have a normal bowel movement for a few days.  Please Note:  You might notice some irritation and congestion in your nose or some drainage.  This is from the oxygen used during your procedure.  There is no need for concern and it should clear up in a day or so.  SYMPTOMS TO REPORT IMMEDIATELY:   Following lower endoscopy (colonoscopy or flexible sigmoidoscopy):  Excessive amounts of blood in the stool  Significant tenderness or worsening of abdominal pains  Swelling of the abdomen that is new, acute  Fever of 100F or higher   Following upper endoscopy (EGD)  Vomiting of blood or coffee ground material  New chest pain or pain under the shoulder blades  Painful or persistently difficult swallowing  New shortness of breath  Fever of 100F or higher  Black, tarry-looking stools  For urgent or emergent issues, a gastroenterologist can be reached at any hour by calling [phone].   DIET: Your first meal following the procedure should be a small meal and then it is ok to progress to  your normal diet. Heavy or fried foods are harder to digest and may make you feel nauseous or bloated.  Likewise, meals heavy in dairy and vegetables can increase bloating.  Drink plenty of fluids but you should avoid alcoholic beverages for 24 hours.  ACTIVITY:  You should plan to take it easy for the rest of today and you should NOT DRIVE or use heavy machinery until tomorrow (because of the sedation medicines used during the test).    FOLLOW UP: Our staff will call the number listed on your records the next business day following your procedure to check on you and address any questions or concerns that you may have regarding the information given to you following your procedure. If we do not reach you, we will leave a message.  However, if you are feeling well and you are not experiencing any problems, there is no need to return our call.  We will assume that you have returned to your regular daily activities without incident.  If any biopsies were taken you will be contacted by phone or by letter within the next 1-3 weeks.  Please call us at [phone] if you have not heard about the biopsies in 3 weeks.    SIGNATURES/CONFIDENTIALITY: You and/or your care partner have signed paperwork which will be entered into your electronic medical record.  These signatures attest to the fact that that the information above on your After Visit Summary has been reviewed   and is understood.  Full responsibility of the confidentiality of this discharge information lies with you and/or your care-partner.YOU HAD AN ENDOSCOPIC PROCEDURE TODAY AT [facility]:   Refer to the procedure report that was given to you for any specific questions about what was found during the examination.  If the procedure report does not answer your questions, please call your gastroenterologist to clarify.  If you requested that your care partner not be given the details of your procedure findings, then the procedure  report has been included in a sealed envelope for you to review at your convenience later.  YOU SHOULD EXPECT: Some feelings of bloating in the abdomen. Passage of more gas than usual.  Walking can help get rid of the air that was put into your GI tract during the procedure and reduce the bloating. If you had a lower endoscopy (such as a colonoscopy or flexible sigmoidoscopy) you may notice spotting of blood in your stool or on the toilet paper. If you underwent a bowel prep for your procedure, you may not have a normal bowel movement for a few days.  Please Note:  You might notice some irritation and congestion in your nose or some drainage.  This is from the oxygen used during your procedure.  There is no need for concern and it should clear up in a day or so.  SYMPTOMS TO REPORT IMMEDIATELY:   Following lower endoscopy (colonoscopy or flexible sigmoidoscopy):  Excessive amounts of blood in the stool  Significant tenderness or worsening of abdominal pains  Swelling of the abdomen that is new, acute  Fever of 100F or higher   Following upper endoscopy (EGD)  Vomiting of blood or coffee ground material  New chest pain or pain under the shoulder blades  Painful or persistently difficult swallowing  New shortness of breath  Fever of 100F or higher  Black, tarry-looking stools  For urgent or emergent issues, a gastroenterologist can be reached at any hour by calling [phone].   DIET: Your first meal following the procedure should be a small meal and then it is ok to progress to your normal diet. Heavy or fried foods are harder to digest and may make you feel nauseous or bloated.  Likewise, meals heavy in dairy and vegetables can increase bloating.  Drink plenty of fluids but you should avoid alcoholic beverages for 24 hours.  ACTIVITY:  You should plan to take it easy for the rest of today and you should NOT DRIVE or use heavy machinery until tomorrow (because of the sedation  medicines used during the test).    FOLLOW UP: Our staff will call the number listed on your records the next business day following your procedure to check on you and address any questions or concerns that you may have regarding the information given to you following your procedure. If we do not reach you, we will leave a message.  However, if you are feeling well and you are not experiencing any problems, there is no need to return our call.  We will assume that you have returned to your regular daily activities without incident.  If any biopsies were taken you will be contacted by phone or by letter within the next 1-3 weeks.  Please call us at [phone] if you have not heard about the biopsies in 3 weeks.    SIGNATURES/CONFIDENTIALITY: You and/or your care partner have signed paperwork which will be entered into your electronic medical record.  These signatures attest to the fact that that the information above on your After Visit Summary has been reviewed and is understood.  Full responsibility of the confidentiality of this discharge information lies with you and/or your care-partner.YOU HAD AN ENDOSCOPIC PROCEDURE TODAY AT [facility]:   Refer to the procedure report that was given to you for any specific questions about what was found during the examination.  If the procedure report does not answer your questions, please call your gastroenterologist to clarify.  If you requested that your care partner not be given the details of your procedure findings, then the procedure report has been included in a sealed envelope for you to review at your convenience later.  YOU SHOULD EXPECT: Some feelings of bloating in the abdomen. Passage of more gas than usual.  Walking can help get rid of the air that was put into your GI tract during the procedure and reduce the bloating. If you had a lower endoscopy (such as a colonoscopy or flexible sigmoidoscopy) you may notice spotting of blood  in your stool or on the toilet paper. If you underwent a bowel prep for your procedure, you may not have a normal bowel movement for a few days.  Please Note:  You might notice some irritation and congestion in your nose or some drainage.  This is from the oxygen used during your procedure.  There is no need for concern and it should clear up in a day or so.  SYMPTOMS TO REPORT IMMEDIATELY:   Following lower endoscopy (colonoscopy or flexible sigmoidoscopy):  Excessive amounts of blood in the stool  Significant tenderness or worsening of abdominal pains  Swelling of the abdomen that is new, acute  Fever of 100F or higher   Following upper endoscopy (EGD)  Vomiting of blood or coffee ground material  New chest pain or pain under the shoulder blades  Painful or persistently difficult swallowing  New shortness of breath  Fever of 100F or higher  Black, tarry-looking stools  For urgent or emergent issues, a gastroenterologist can be reached at any hour by calling [phone].   DIET: Your first meal following the procedure should be a small meal and then it is ok to progress to your normal diet. Heavy or fried foods are harder to digest and may make you feel nauseous or bloated.  Likewise, meals heavy in dairy and vegetables can increase bloating.  Drink plenty of fluids but you should avoid alcoholic beverages for 24 hours.  ACTIVITY:  You should plan to take it easy for the rest of today and you should NOT DRIVE or use heavy machinery until tomorrow (because of the sedation medicines used during the test).    FOLLOW UP: Our staff will call the number listed on your records the next business day following your procedure to check on you and address any questions or concerns that you may have regarding the information given to you following your procedure. If we do not reach you, we will leave a message.  However, if you are feeling well and you are not experiencing any problems, there  is no need to return our call.  We will assume that you have returned to your regular daily activities without incident.  If any biopsies were taken you will be contacted by phone or by letter within the next 1-3 weeks.  Please call us at [phone] if you have not heard about the biopsies in 3 weeks.  SIGNATURES/CONFIDENTIALITY: You and/or your care partner have signed paperwork which will be entered into your electronic medical record.  These signatures attest to the fact that that the information above on your After Visit Summary has been reviewed and is understood.  Full responsibility of the confidentiality of this discharge information lies with you and/or your care-partner.YOU HAD AN ENDOSCOPIC PROCEDURE TODAY AT [facility]:   Refer to the procedure report that was given to you for any specific questions about what was found during the examination.  If the procedure report does not answer your questions, please call your gastroenterologist to clarify.  If you requested that your care partner not be given the details of your procedure findings, then the procedure report has been included in a sealed envelope for you to review at your convenience later.  YOU SHOULD EXPECT: Some feelings of bloating in the abdomen. Passage of more gas than usual.  Walking can help get rid of the air that was put into your GI tract during the procedure and reduce the bloating. If you had a lower endoscopy (such as a colonoscopy or flexible sigmoidoscopy) you may notice spotting of blood in your stool or on the toilet paper. If you underwent a bowel prep for your procedure, you may not have a normal bowel movement for a few days.  Please Note:  You might notice some irritation and congestion in your nose or some drainage.  This is from the oxygen used during your procedure.  There is no need for concern and it should clear up in a day or so.  SYMPTOMS TO REPORT IMMEDIATELY:  Following upper  endoscopy (EGD)  Vomiting of blood or coffee ground material  New chest pain or pain under the shoulder blades  Painful or persistently difficult swallowing  New shortness of breath  Fever of 100F or higher  Black, tarry-looking stools  For urgent or emergent issues, a gastroenterologist can be reached at any hour by calling [phone].   DIET: Your first meal following the procedure should be a small meal and then it is ok to progress to your normal diet. Heavy or fried foods are harder to digest and may make you feel nauseous or bloated.  Likewise, meals heavy in dairy and vegetables can increase bloating.  Drink plenty of fluids but you should avoid alcoholic beverages for 24 hours.  ACTIVITY:  You should plan to take it easy for the rest of today and you should NOT DRIVE or use heavy machinery until tomorrow (because of the sedation medicines used during the test).    FOLLOW UP: Our staff will call the number listed on your records the next business day following your procedure to check on you and address any questions or concerns that you may have regarding the information given to you following your procedure. If we do not reach you, we will leave a message.  However, if you are feeling well and you are not experiencing any problems, there is no need to return our call.  We will assume that you have returned to your regular daily activities without incident.  If any biopsies were taken you will be contacted by phone or by letter within the next 1-3 weeks.  Please call us at [phone] if you have not heard about the biopsies in 3 weeks.    SIGNATURES/CONFIDENTIALITY: You and/or your care partner have signed paperwork which will be entered into your electronic medical record.  These signatures attest to  the fact that that the information above on your After Visit Summary has been reviewed and is understood.  Full responsibility of the confidentiality of this discharge information  lies with you and/or your care-partner.  Hiatal hernia and Barrett's esophagus informtion given.

## 2015-06-12 NOTE — Op Note (Signed)
[place] Patient Name: Tanya Kelley Procedure Date: 06/12/2015 3:44 PM MRN: [id number] Endoscopist: Remo Lipps P. Armbruster MD, MD Age: 45 Date of Birth: [birth year] Gender: Female Procedure:                Upper GI endoscopy Indications:              Follow-up of Barrett's esophagus (reported 1cm                            segment in 2014), GERD Medicines:                Monitored Anesthesia Care Procedure:                Pre-Anesthesia Assessment:                           - Prior to the procedure, a History and Physical                            was performed, and patient medications and                            allergies were reviewed. The patient's tolerance of                            previous anesthesia was also reviewed. The risks                            and benefits of the procedure and the sedation                            options and risks were discussed with the patient.                            All questions were answered, and informed consent                            was obtained. Prior Anticoagulants: The patient has                            taken aspirin, last dose was 1 day prior to                            procedure. ASA Grade Assessment: II - A patient                            with mild systemic disease. After reviewing the                            risks and benefits, the patient was deemed in                            satisfactory condition to undergo the procedure.  After obtaining informed consent, the endoscope was                            passed under direct vision. Throughout the                            procedure, the patient's blood pressure, pulse, and                            oxygen saturations were monitored continuously. The                            Model GIF-[id number] scope was introduced                            through the mouth, and advanced to the second part                 of duodenum. The upper GI endoscopy was                            accomplished without difficulty. The patient                            tolerated the procedure well. Scope In: Scope Out: Findings:                 Esophagogastric landmarks were identified: the                            Z-line was found at 38 cm, the gastroesophageal                            junction was found at 38 cm and the upper extent of                            the gastric folds was found at 39 cm from the                            incisors.                           A 1 cm hiatal hernia was present.                           There were esophageal mucosal changes classified as                            Barrett's stage C0-M1 per Prague criteria present                            at the gastroesophageal junction (3 tongues). The                            maximum longitudinal extent of these mucosal  changes was roughly 8-10 mm in length. No                            nodularity was appreciated. Mucosa was biopsied                            with a cold forceps for histology.                           The exam of the esophagus was otherwise normal.                           The entire examined stomach was normal.                           The duodenal bulb and second portion of the                            duodenum were normal. Complications:            No immediate complications. Estimated blood loss:                            Minimal. Estimated Blood Loss:     Estimated blood loss was minimal. Impression:               - Esophagogastric landmarks identified.                           - 1 cm hiatal hernia.                           - Esophageal mucosal changes classified as                            Barrett's stage C0-M1 per Prague criteria. Biopsied.                           - Normal stomach.                           - Normal duodenal bulb and second portion of  the                            duodenum. Recommendation:           - Patient has a contact number available for                            emergencies. The signs and symptoms of potential                            delayed complications were discussed with the                            patient. Return to normal activities tomorrow.  Written discharge instructions were provided to the                            patient.                           - Resume previous diet.                           - Continue present medications.                           - Await pathology results. Remo Lipps P. Armbruster MD, MD 06/12/2015 4:15:04 PM This report has been signed electronically.

## 2015-06-12 NOTE — Progress Notes (Signed)
Called to room to assist during endoscopic procedure.  Patient ID and intended procedure confirmed with present staff. Received instructions for my participation in the procedure from the performing physician.  

## 2015-06-13 ENCOUNTER — Telehealth: Payer: Self-pay

## 2015-06-13 NOTE — Telephone Encounter (Signed)
  Follow up Call-  Call back number 06/12/2015  Post procedure Call Back phone  # [phone]  Permission to leave phone message Yes    Patient was called for follow up after her procedure on 06/12/2015. No answer at the number given for follow up phone call. A message was left on the answering machine.

## 2015-06-19 ENCOUNTER — Encounter: Payer: Self-pay | Admitting: Gastroenterology

## 2015-07-16 ENCOUNTER — Ambulatory Visit: Payer: BLUE CROSS/BLUE SHIELD | Admitting: Cardiology

## 2015-11-12 ENCOUNTER — Ambulatory Visit: Payer: BLUE CROSS/BLUE SHIELD | Admitting: Family Medicine

## 2015-11-16 ENCOUNTER — Other Ambulatory Visit: Payer: Self-pay | Admitting: Family Medicine

## 2015-11-16 ENCOUNTER — Ambulatory Visit (INDEPENDENT_AMBULATORY_CARE_PROVIDER_SITE_OTHER): Payer: BLUE CROSS/BLUE SHIELD

## 2015-11-16 ENCOUNTER — Ambulatory Visit (INDEPENDENT_AMBULATORY_CARE_PROVIDER_SITE_OTHER): Payer: BLUE CROSS/BLUE SHIELD | Admitting: Family Medicine

## 2015-11-16 ENCOUNTER — Encounter: Payer: Self-pay | Admitting: Family Medicine

## 2015-11-16 ENCOUNTER — Telehealth: Payer: Self-pay

## 2015-11-16 VITALS — BP 116/76 | HR 102 | Temp 97.9°F | Resp 18 | Ht 67.0 in | Wt 178.2 lb

## 2015-11-16 DIAGNOSIS — T148 Other injury of unspecified body region: Secondary | ICD-10-CM

## 2015-11-16 DIAGNOSIS — M25551 Pain in right hip: Secondary | ICD-10-CM | POA: Diagnosis not present

## 2015-11-16 DIAGNOSIS — M25561 Pain in right knee: Secondary | ICD-10-CM

## 2015-11-16 DIAGNOSIS — M25461 Effusion, right knee: Secondary | ICD-10-CM

## 2015-11-16 DIAGNOSIS — W57XXXA Bitten or stung by nonvenomous insect and other nonvenomous arthropods, initial encounter: Secondary | ICD-10-CM

## 2015-11-16 DIAGNOSIS — R21 Rash and other nonspecific skin eruption: Secondary | ICD-10-CM

## 2015-11-16 LAB — CBC WITH DIFFERENTIAL/PLATELET
Basophils Absolute: 0 10*3/uL (ref 0.0–0.1)
Basophils Relative: 0.3 % (ref 0.0–3.0)
Eosinophils Absolute: 0.2 10*3/uL (ref 0.0–0.7)
Eosinophils Relative: 1.7 % (ref 0.0–5.0)
HCT: 41.7 % (ref 36.0–46.0)
Hemoglobin: 14.4 g/dL (ref 12.0–15.0)
Lymphocytes Relative: 30.4 % (ref 12.0–46.0)
Lymphs Abs: 2.7 10*3/uL (ref 0.7–4.0)
MCHC: 34.6 g/dL (ref 30.0–36.0)
MCV: 88 fl (ref 78.0–100.0)
Monocytes Absolute: 0.4 10*3/uL (ref 0.1–1.0)
Monocytes Relative: 5.1 % (ref 3.0–12.0)
Neutro Abs: 5.5 10*3/uL (ref 1.4–7.7)
Neutrophils Relative %: 62.5 % (ref 43.0–77.0)
Platelets: 203 10*3/uL (ref 150.0–400.0)
RBC: 4.74 Mil/uL (ref 3.87–5.11)
RDW: 13.1 % (ref 11.5–15.5)
WBC: 8.9 10*3/uL (ref 4.0–10.5)

## 2015-11-16 LAB — GLUCOSE, POCT (MANUAL RESULT ENTRY): POC Glucose: 114 mg/dl — AB (ref 70–99)

## 2015-11-16 MED ORDER — NAPROXEN 500 MG PO TABS: 500.0000 mg | ORAL_TABLET | Freq: Two times a day (BID) | ORAL | 0 refills | Status: DC

## 2015-11-16 MED ORDER — DOXYCYCLINE HYCLATE 100 MG PO TABS: 100.0000 mg | ORAL_TABLET | Freq: Two times a day (BID) | ORAL | 0 refills | Status: DC

## 2015-11-16 NOTE — Progress Notes (Addendum)
Tanya Kelley , 1970/08/05, 45 y.o., female MRN: [id number] Patient Care Team    Relationship Specialty Notifications Start End  Ma Hillock, DO PCP - General Family Medicine  04/04/15     CC: Knee pain Subjective: Pt presents for an acute OV with complaints of right knee pain and swelling of 2 months duration.  She states her knee cap has a gold ball size lump and her "bone" (points to medial aspect of femur) is painful. She also reports her right hip is waking her in the middle of the night with throbbing/dull/ache for the last week. She endorses fever, chills, sweats and feeling "sick or off". She endorses having a skin breakout over her abdomen, face, right forearm and inner thighs over the last few weeks. She also endorses having a tick bite 2-3 months on the right side of her abdomen that bothered her for quite a while. She denies increase in activity, injury, arthritis prior to onset of condition. She has used ice, elevation and wrapping with an ACE wrap, nothing of which is making it better.   No Known Allergies Social History  Substance Use Topics  . Smoking status: Current Some Day Smoker    Packs/day: 0.00    Years: 27.00    Types: Cigarettes  . Smokeless tobacco: Never Used     Comment: only smokes occasionally, with drinks  . Alcohol use Yes     Comment: occassional    Past Medical History:  Diagnosis Date  . ABDOMINAL BLOATING 04/10/2009   Qualifier: Diagnosis of  By: Nelson-Smith CMA (AAMA), Dottie    . Abdominal pain, unspecified site 04/10/2009   Qualifier: Diagnosis of  By: Nelson-Smith CMA (AAMA), Dottie    . ANEMIA, IRON DEFICIENCY 04/12/2009   Qualifier: Diagnosis of  By: Surface RN, Butch Penny    . B12 DEFICIENCY 04/12/2009   Qualifier: Diagnosis of  By: Surface RN, Butch Penny    . Barrett esophagus 2013  . CERVICAL RADICULOPATHY, RIGHT 06/26/2009   Qualifier: Diagnosis of  By: Lorelei Pont MD, Frederico Hamman    . Chest pain   . Colon polyps   . CONSTIPATION 04/10/2009   Qualifier: Diagnosis of  By: Nelson-Smith CMA (AAMA), Dottie    . COPD 04/10/2009   no per pt 05-25-15  . Diarrhea 04/10/2009   Qualifier: Diagnosis of  By: Nelson-Smith CMA (AAMA), Dottie    . Fatigue   . GERD (gastroesophageal reflux disease)   . Hot flashes   . Hyperlipidemia   . Jaw pain   . MELENA 04/10/2009   Qualifier: Diagnosis of  By: Nelson-Smith CMA (AAMA), Dottie    . Nausea   . SACROILIITIS, RIGHT 06/26/2009   Qualifier: Diagnosis of  By: Lorelei Pont MD, Frederico Hamman    . Shoulder pain   . WEIGHT LOSS 04/10/2009   Qualifier: Diagnosis of  By: Harlon Ditty CMA (AAMA), Dottie     Past Surgical History:  Procedure Laterality Date  . CHOLECYSTECTOMY    . COLONOSCOPY    . FOOT SURGERY Bilateral   . TUBAL LIGATION    . UPPER GASTROINTESTINAL ENDOSCOPY     Family History  Problem Relation Age of Onset  . Hypertension Father   . Clotting disorder Father   . Colon polyps Father   . Healthy Mother   . Healthy Brother   . Heart attack Paternal Grandfather   . Heart disease Paternal Grandfather   . Early death Paternal Grandfather 53    MI  . Colon cancer Paternal Grandmother  73    alive  . Clotting disorder Paternal Grandmother   . Colon polyps Paternal Aunt     x 2  . Esophageal cancer Neg Hx   . Rectal cancer Neg Hx   . Stomach cancer Neg Hx      Medication List       Accurate as of 11/16/15  9:47 AM. Always use your most recent med list.          atorvastatin 40 MG tablet Commonly known as:  LIPITOR Take 1 tablet (40 mg total) by mouth daily.   colestipol 1 g tablet Commonly known as:  COLESTID Take 1 tablet (1 g total) by mouth 2 (two) times daily.   metoprolol tartrate 25 MG tablet Commonly known as:  LOPRESSOR Take 1 tablet (25 mg total) by mouth 2 (two) times daily.   omeprazole 40 MG capsule Commonly known as:  PRILOSEC Take 1 capsule (40 mg total) by mouth 2 (two) times daily.   valACYclovir 1000 MG tablet Commonly known as:  VALTREX Take 2 tabs  with onset of symptoms and then 2 tabs in 12 hours PRN for cold sore       Results for orders placed or performed in visit on 11/16/15 (from the past 24 hour(s))  POCT glucose (manual entry)     Status: Abnormal   Collection Time: 11/16/15  9:24 AM  Result Value Ref Range   POC Glucose 114 (A) 70 - 99 mg/dl   No results found.   ROS: Negative, with the exception of above mentioned in HPI   Objective:  BP 116/76 (BP Location: Left Arm, Patient Position: Sitting, Cuff Size: Normal)   Pulse (!) 102   Temp 97.9 F (36.6 C) (Oral)   Resp 18   Ht 5\' 7"  (1.702 m)   Wt 178 lb 4 oz (80.9 kg)   SpO2 98%   BMI 27.92 kg/m  Body mass index is 27.92 kg/m. Gen: Afebrile. No acute distress. Nontoxic in appearance, well developed, well nourished. Pleasant female.  HENT: AT. [place].  MMM, no oral lesions. Eyes:Pupils Equal Round Reactive to light, Extraocular movements intact,  Conjunctiva without redness, discharge or icterus. CV: RRR  Chest: CTAB, no wheeze or crackles.  MSK:   Right hip: no erythema, no swelling, no TTP, decreased flexion, pain with internal rotation of hip. NV intact  Distally.   Right knee: no erythema, moderate swelling and fluid collection anterior patella and effusion. TTP patella and medial distal femur. No joint line tenderness. No ligament laxity.  NV intact distally.  Skin: No purpura or petechiae. No skin rash.  Neuro: mild limp. PERLA. EOMi. Alert. Oriented x3   Procedure: Knee aspiration Consent obtained and verified. Sterile betadine prep. Furthur cleansed with alcohol. Topical analgesic spray: Ethyl chloride. Right knee pre-patellar effusion aspiration. 18 g needle.  Approached in typical fashion with: lateral inferior approach. Completed without difficulty, ~5cc thick dark bloody fluid aspirated. No bleeding noted. Pt tolerated procedure well. Band-aid applied. Pt advised to placed ACE wrap compression for 1 week.  Aftercare instructions and Red flags  advised.   Assessment/Plan: Tanya Kelley is a 45 y.o. female present for acute OV for  Knee pain, acute, right/knee effusion/right hip pain  - CBC w/Diff - doxycycline (VIBRA-TABS) 100 MG tablet; Take 1 tablet (100 mg total) by mouth 2 (two) times daily.  Dispense: 28 tablet; Refill: 0 - B. burgdorfi antibodies by WB - DG HIP UNILAT WITH PELVIS 2-3 VIEWS RIGHT; Future -  DG Knee Complete 4 Views Right; Future - Consent obtained and aspiration of effusion completed with return of of ~5 cc of thick dark bloody fluid  Rash and nonspecific skin eruption/Tick bite - B. burgdorfi antibodies by WB  Return in about 1 week (around 11/23/2015), or right knee recheck.   electronically signed by:  Howard Pouch, DO  [place]

## 2015-11-16 NOTE — Patient Instructions (Addendum)
Knee Effusion Knee effusion means that you have excess fluid in your knee joint. This can cause pain and swelling in your knee. This may make your knee more difficult to bend and move. That is because there is increased pain and pressure in the joint. If there is fluid in your knee, it often means that something is wrong inside your knee, such as severe arthritis, abnormal inflammation, or an infection. Another common cause of knee effusion is an injury to the knee muscles, ligaments, or cartilage. HOME CARE INSTRUCTIONS  Use crutches as directed by your health care provider.  Wear a knee brace as directed by your health care provider.  Apply ice to the swollen area:  Put ice in a plastic bag.  Place a towel between your skin and the bag.  Leave the ice on for 20 minutes, 2-3 times per day.  Keep your knee raised (elevated) when you are sitting or lying down.  Take medicines only as directed by your health care provider.  Do any rehabilitation or strengthening exercises as directed by your health care provider.  Rest your knee as directed by your health care provider. You may start doing your normal activities again when your health care provider approves.   Keep all follow-up visits as directed by your health care provider. This is important. SEEK MEDICAL CARE IF:  You have ongoing (persistent) pain in your knee. SEEK IMMEDIATE MEDICAL CARE IF:  You have increased swelling or redness of your knee.  You have severe pain in your knee.  You have a fever.   This information is not intended to replace advice given to you by your health care provider. Make sure you discuss any questions you have with your health care provider.   Document Released: 05/03/2003 Document Revised: 03/03/2014 Document Reviewed: 09/26/2013 Elsevier Interactive Patient Education 2016 [place] Doxycycline today, twice a day for tick exposure and knee effusion.  I have ordered an xray of  your right hip and right knee at [facility]. You can have this completed today or over the weekend. I will want to see yo uback next week to recheck and discuss if need for further eval.

## 2015-11-16 NOTE — Telephone Encounter (Signed)
Patient with diarrhea and abdominal pain. She will come in on Monday at 4:00

## 2015-11-19 ENCOUNTER — Encounter: Payer: Self-pay | Admitting: Gastroenterology

## 2015-11-19 ENCOUNTER — Ambulatory Visit (INDEPENDENT_AMBULATORY_CARE_PROVIDER_SITE_OTHER): Payer: BLUE CROSS/BLUE SHIELD | Admitting: Gastroenterology

## 2015-11-19 ENCOUNTER — Telehealth: Payer: Self-pay | Admitting: Family Medicine

## 2015-11-19 VITALS — BP 96/68 | HR 114 | Ht 67.0 in | Wt 178.0 lb

## 2015-11-19 DIAGNOSIS — R109 Unspecified abdominal pain: Secondary | ICD-10-CM

## 2015-11-19 DIAGNOSIS — R197 Diarrhea, unspecified: Secondary | ICD-10-CM | POA: Diagnosis not present

## 2015-11-19 DIAGNOSIS — K219 Gastro-esophageal reflux disease without esophagitis: Secondary | ICD-10-CM | POA: Diagnosis not present

## 2015-11-19 DIAGNOSIS — M25551 Pain in right hip: Secondary | ICD-10-CM

## 2015-11-19 DIAGNOSIS — M25561 Pain in right knee: Secondary | ICD-10-CM | POA: Insufficient documentation

## 2015-11-19 DIAGNOSIS — M25461 Effusion, right knee: Secondary | ICD-10-CM | POA: Insufficient documentation

## 2015-11-19 MED ORDER — DICYCLOMINE HCL 10 MG PO CAPS: 10.0000 mg | ORAL_CAPSULE | Freq: Three times a day (TID) | ORAL | 3 refills | Status: DC | PRN

## 2015-11-19 MED ORDER — PANTOPRAZOLE SODIUM 40 MG PO TBEC: 40.0000 mg | DELAYED_RELEASE_TABLET | Freq: Two times a day (BID) | ORAL | 3 refills | Status: DC

## 2015-11-19 NOTE — Patient Instructions (Signed)
If you are age 43 or older, your body mass index should be between 23-30. Your Body mass index is 27.88 kg/m. If this is out of the aforementioned range listed, please consider follow up with your Primary Care Provider.  If you are age 20 or younger, your body mass index should be between 19-25. Your Body mass index is 27.88 kg/m. If this is out of the aformentioned range listed, please consider follow up with your Primary Care Provider.   Please purchase the following medications over the counter and take as directed: Immodium as directed.  We have sent the following medications to your pharmacy for you to pick up at your convenience: Bentyl and protonix.  Your physician has requested that you go to the basement for lab work before leaving today:

## 2015-11-19 NOTE — Telephone Encounter (Signed)
I have spoken to patient about her lab results and imaging results today.   She reports the area has again filed back up on her knee and is swollen. She continues to have increased pain in her right hip as well. Analysis of knee effusion is still pending.  MRI was attempted to be ordered, however insurance was rejecting imaging.    - Please call pt: Secondary to insurance rejecting MRI will refer to Orthopedics for knee and hip pain. Please make pt aware. If we can get her in to see ortho before Friday, then she does not need follow up here.   - Of note: she asked about DEXA and mammogram. She is not due for a dexa scan. Her mammogram from [facility] from 2016 with Ultrasound is not in the system. We will  Need this record for [facility] to have [facility]

## 2015-11-19 NOTE — Telephone Encounter (Signed)
Spoke with patient reviewed information and instructions. Patient verbalized understanding. 

## 2015-11-19 NOTE — Progress Notes (Signed)
HPI :  45 y/o female here for re-evaluation of multiple issues:  GERD / Barrett's  - EGD showed short segment of BE since last visit. She is omeprazole 40mg  BID as well as TUMS. She reports her heartburn is not well controlled. She feels symptoms at night when she lies. She has pyrosis and some regurgitation. She has been on Zantac in the past which didn't help. She feels significantly worse if she did not take omeprazole. She has tried using head of bed to elevated. She denies any eating prior to bedtime. She denies any dysphagia.   RUQ PAIN - ongoing discomfort in the RUQ are which is chronic. She thinks the pain is present all the time, never goes away. Sometimes eating can make it worse, not always. She can have tenderness to the touch. It has been present for years. Prior EGDs / CT scan has not shown an abnormality. She had a cholecytectomy for this pain which did not help it at all  Loose stools -  stools are "yellow" in color, she reports it appears greasy / fatty ongoing which is bothering her ongoing since GB removed. She has stools shortly after eating something. She typically has a bowel movement after she eats. She thinks it can be any kind of food. She is having 3 BMs per day at baseline. She has had some accidents when having the urge. She feels significant bloating and gas, with abdominal distension. She had a trial of colestid since the last visit but made her feel nauseated and she didn't like it. She has had loose stools at baseline for a "long time". She has had some abdominal cramps with this as well. No blood in the stools.She has been taking Aleve once daily in recent weeks. She's had prior celiac testing which was negative.   Prior workup: EGD 03/2009 - normal esophagus, normal stomach, normal duodenum Colonoscopy 03/2009 - hepatic flexure polyp removed, no pathology noted HIDA scan 07/02/2012 - normal GB EF EGD 06/29/2012 - 1cm segment of irregular z-line, biopsies c/w  nondysplastic Barrett's, otherwise normal endoscopy Colonoscopy 06/29/2012 - normal colonoscopy without polyps, internal hemorrhoids noted Negative prior celiac panel EGD 06/12/15 - 1cm hiatal hernia, 28mm tongue of bx show nondysplastic BE, recall in 3-5 years CT abdomen / pelvis with contrast 08/31/15 - benign hepatic cyst, diverticulosis, otherwise normal    Past Medical History:  Diagnosis Date  . ABDOMINAL BLOATING 04/10/2009   Qualifier: Diagnosis of  By: Nelson-Smith CMA (AAMA), Dottie    . Abdominal pain, unspecified site 04/10/2009   Qualifier: Diagnosis of  By: Nelson-Smith CMA (AAMA), Dottie    . ANEMIA, IRON DEFICIENCY 04/12/2009   Qualifier: Diagnosis of  By: Surface RN, Butch Penny    . B12 DEFICIENCY 04/12/2009   Qualifier: Diagnosis of  By: Surface RN, Butch Penny    . Barrett esophagus 2013  . CERVICAL RADICULOPATHY, RIGHT 06/26/2009   Qualifier: Diagnosis of  By: Lorelei Pont MD, Frederico Hamman    . Chest pain   . Colon polyps   . CONSTIPATION 04/10/2009   Qualifier: Diagnosis of  By: Nelson-Smith CMA (AAMA), Dottie    . COPD 04/10/2009   no per pt 05-25-15  . Diarrhea 04/10/2009   Qualifier: Diagnosis of  By: Nelson-Smith CMA (AAMA), Dottie    . Fatigue   . GERD (gastroesophageal reflux disease)   . Hot flashes   . Hyperlipidemia   . Jaw pain   . MELENA 04/10/2009   Qualifier: Diagnosis of  By: Harlon Ditty CMA (  AAMA), Dottie    . Nausea   . SACROILIITIS, RIGHT 06/26/2009   Qualifier: Diagnosis of  By: Lorelei Pont MD, Frederico Hamman    . Shoulder pain   . WEIGHT LOSS 04/10/2009   Qualifier: Diagnosis of  By: Harlon Ditty CMA (AAMA), Dottie       Past Surgical History:  Procedure Laterality Date  . CHOLECYSTECTOMY    . COLONOSCOPY    . FOOT SURGERY Bilateral   . TUBAL LIGATION    . UPPER GASTROINTESTINAL ENDOSCOPY     Family History  Problem Relation Age of Onset  . Hypertension Father   . Clotting disorder Father   . Colon polyps Father   . Healthy Mother   . Healthy Brother   . Heart attack  Paternal Grandfather   . Heart disease Paternal Grandfather   . Early death Paternal Grandfather 72    MI  . Colon cancer Paternal Grandmother 54    alive  . Clotting disorder Paternal Grandmother   . Colon polyps Paternal Aunt     x 2  . Esophageal cancer Neg Hx   . Rectal cancer Neg Hx   . Stomach cancer Neg Hx    Social History  Substance Use Topics  . Smoking status: Current Some Day Smoker    Packs/day: 0.00    Years: 27.00    Types: Cigarettes  . Smokeless tobacco: Never Used     Comment: only smokes occasionally, with drinks  . Alcohol use Yes     Comment: occassional    Current Outpatient Prescriptions  Medication Sig Dispense Refill  . colestipol (COLESTID) 1 g tablet Take 1 tablet (1 g total) by mouth 2 (two) times daily. 60 tablet 5  . doxycycline (VIBRA-TABS) 100 MG tablet Take 1 tablet (100 mg total) by mouth 2 (two) times daily. 28 tablet 0  . metoprolol tartrate (LOPRESSOR) 25 MG tablet Take 1 tablet (25 mg total) by mouth 2 (two) times daily. 60 tablet 3  . naproxen (NAPROSYN) 500 MG tablet Take 1 tablet (500 mg total) by mouth 2 (two) times daily with a meal. 30 tablet 0  . omeprazole (PRILOSEC) 40 MG capsule Take 1 capsule (40 mg total) by mouth 2 (two) times daily. 60 capsule 5  . valACYclovir (VALTREX) 1000 MG tablet Take 2 tabs with onset of symptoms and then 2 tabs in 12 hours PRN for cold sore 20 tablet 3   No current facility-administered medications for this visit.    No Known Allergies   Review of Systems: All systems reviewed and negative except where noted in HPI.   Lab Results  Component Value Date   WBC 8.9 11/16/2015   HGB 14.4 11/16/2015   HCT 41.7 11/16/2015   MCV 88.0 11/16/2015   PLT 203.0 11/16/2015   Lab Results  Component Value Date   CREATININE 0.78 03/29/2015   BUN 13 03/29/2015   NA 140 03/29/2015   K 3.9 03/29/2015   CL 105 03/29/2015   CO2 24 03/29/2015    Lab Results  Component Value Date   ALT 30 05/18/2015    AST 19 05/18/2015   ALKPHOS 69 05/18/2015   BILITOT 0.3 05/18/2015      Dg Knee Complete 4 Views Right  Result Date: 11/16/2015 CLINICAL DATA:  Knee pain.  No known injury . EXAM: RIGHT KNEE - COMPLETE 4+ VIEW COMPARISON:  06/19/2009. FINDINGS: Prominent prepatellar soft tissue swelling is present. Air within the prepatellar swelling cannot be excluded. Infection could present this fashion .  No bony abnormality identified. MRI of the right knee may prove useful for further evaluation. IMPRESSION: 1. Prepatellar soft tissue swelling with possible air within the swelling. Infection cannot be excluded. MRI of the right knee may prove useful for further evaluation. 2. No acute bony abnormality. Electronically Signed   By: Marcello Moores  Register   On: 11/16/2015 14:03   Dg Hip Unilat With Pelvis 2-3 Views Right  Result Date: 11/16/2015 CLINICAL DATA:  Right hip pain.  Right knee pain. EXAM: DG HIP (WITH OR WITHOUT PELVIS) 2-3V RIGHT COMPARISON:  No recent prior. FINDINGS: No acute bony abnormality identified. No evidence of fracture dislocation. Tiny sclerotic focus in the right acetabulum consistent with bone island. Pelvic calcification noted consistent phlebolith. IMPRESSION: No acute bony abnormality identified Electronically Signed   By: Marcello Moores  Register   On: 11/16/2015 14:01    Physical Exam: BP 96/68   Pulse (!) 114   Ht 5\' 7"  (1.702 m)   Wt 178 lb (80.7 kg)   BMI 27.88 kg/m  Constitutional: Pleasant,well-developed, female in no acute distress. HEENT: Normocephalic and atraumatic. Conjunctivae are normal. No scleral icterus. Neck supple.  Cardiovascular: Normal rate, regular rhythm.  Pulmonary/chest: Effort normal and breath sounds normal. No wheezing, rales or rhonchi. Abdominal: Soft, nondistended, mild RUQ TTP along costal margin, negative carnett.   There are no masses palpable. No hepatomegaly. Extremities: no edema Lymphadenopathy: No cervical adenopathy noted. Neurological: Alert  and oriented to person place and time. Skin: Skin is warm and dry. No rashes noted. Psychiatric: Normal mood and affect. Behavior is normal.   ASSESSMENT AND PLAN: 45 y/o female here for evaluation of the following issues:  GERD / Barrett's - short segment BE noted on recent EGD, reassured this is a low risk lesion for development of esophageal cancer but recommend surveillance EGD in 3-5 years. Otherwise GERD symptoms not well controlled on omeprazole 40mg  BID - still with significant breakthrough. We will try a switch to protonix 40mg  BID since she has never been on another PPI, see if this helps. If symptoms persist may consider pH testing to assess for nonacid reflux / hypersensitive esophagus, no evidence of active esophagitis on EGD.  Chronic diarrhea - longstanding since cholecystectomy, but trial of colestid was not tolerated. I suspect functional, but history concerning for possible steatorrhea. Celiac ABs negative. Will send fecal fat, fecal elastase, and calprotectin to ensure normal. Recommend trial of immodium in the interim with bentyl PRN. If symptoms persist and stool studies negative will consider trial of rifaximin or Elavil.   Chronic abdominal pain - suspect abdominal wall pain. May consider trial of Elavil pending stool studies as above which would treat this and her diarrhea. Recent CT and EGD negative, reassured her.   [place] Cellar, MD [facility] Pager [phone]

## 2015-11-20 ENCOUNTER — Telehealth: Payer: Self-pay | Admitting: Family Medicine

## 2015-11-20 LAB — CYTOLOGY, FLUID (SOLSTAS)

## 2015-11-20 LAB — LYME ABY, WSTRN BLT IGG & IGM W/BANDS
B BURGDORFERI IGM ABS (IB): NEGATIVE
B burgdorferi IgG Abs (IB): NEGATIVE
LYME DISEASE 18 KD IGG: NONREACTIVE
LYME DISEASE 23 KD IGM: NONREACTIVE
LYME DISEASE 41 KD IGM: NONREACTIVE
LYME DISEASE 58 KD IGG: NONREACTIVE
Lyme Disease 23 kD IgG: NONREACTIVE
Lyme Disease 28 kD IgG: NONREACTIVE
Lyme Disease 30 kD IgG: NONREACTIVE
Lyme Disease 39 kD IgG: NONREACTIVE
Lyme Disease 39 kD IgM: NONREACTIVE
Lyme Disease 41 kD IgG: NONREACTIVE
Lyme Disease 45 kD IgG: NONREACTIVE
Lyme Disease 66 kD IgG: NONREACTIVE
Lyme Disease 93 kD IgG: NONREACTIVE

## 2015-11-20 NOTE — Telephone Encounter (Signed)
Left message with lab results and instructions on patient voice mail per DPR 

## 2015-11-20 NOTE — Telephone Encounter (Signed)
Please call pt: - knee fluid showed degenerative blood (old), and inflammatory cells. No signs of infection. More traumatic.  - I would recommend continuing the doxycyline, just because of the tick exposure. Her knee aspirate did not appear infectious though.

## 2015-11-20 NOTE — Telephone Encounter (Signed)
Left message with lab results on patient voice mail per DPR. 

## 2015-11-20 NOTE — Telephone Encounter (Signed)
Please call Tanya Kelley:  her lyme titers are negative.  - I am still awaiting report from aspirated fluid. Continue abx for now.

## 2015-11-23 ENCOUNTER — Ambulatory Visit: Payer: BLUE CROSS/BLUE SHIELD | Admitting: Family Medicine

## 2015-11-28 LAB — BODY FLUID CULTURE
Gram Stain: NONE SEEN
Organism ID, Bacteria: NO GROWTH

## 2015-11-30 ENCOUNTER — Encounter: Payer: Self-pay | Admitting: Podiatry

## 2015-11-30 ENCOUNTER — Ambulatory Visit (INDEPENDENT_AMBULATORY_CARE_PROVIDER_SITE_OTHER): Payer: BLUE CROSS/BLUE SHIELD | Admitting: Podiatry

## 2015-11-30 ENCOUNTER — Ambulatory Visit (INDEPENDENT_AMBULATORY_CARE_PROVIDER_SITE_OTHER): Payer: BLUE CROSS/BLUE SHIELD

## 2015-11-30 DIAGNOSIS — D3610 Benign neoplasm of peripheral nerves and autonomic nervous system, unspecified: Secondary | ICD-10-CM

## 2015-11-30 DIAGNOSIS — R52 Pain, unspecified: Secondary | ICD-10-CM

## 2015-11-30 DIAGNOSIS — L84 Corns and callosities: Secondary | ICD-10-CM | POA: Diagnosis not present

## 2015-11-30 DIAGNOSIS — M216X9 Other acquired deformities of unspecified foot: Secondary | ICD-10-CM | POA: Diagnosis not present

## 2015-11-30 DIAGNOSIS — B351 Tinea unguium: Secondary | ICD-10-CM

## 2015-11-30 DIAGNOSIS — L600 Ingrowing nail: Secondary | ICD-10-CM | POA: Diagnosis not present

## 2015-11-30 MED ORDER — CEPHALEXIN 500 MG PO CAPS: 500.0000 mg | ORAL_CAPSULE | Freq: Three times a day (TID) | ORAL | 2 refills | Status: DC

## 2015-11-30 NOTE — Progress Notes (Signed)
Subjective:    Patient ID: Tanya Kelley, female    DOB: Jan 06, 1971, 45 y.o.   MRN: [id number]  HPI  45 year old female presents the also concerns of ingrown toenails to both of her big toes which is been ongoing for the last several months. The areas are painful with pressure in shoe gear. She tries to trim them herself any relief. She also states that she has calluses to both of her feet which are painful when she walks. She did recently hit her right fifth toe of the toe is been somewhat swollen and painful with pressure in certain shoes as well. This has been improving. This happened last week. She also gets a clicking sensation in her left foot when she walks in the ball of her foot. She has occasional numbness and tingling to her left second third toes. No recent injury or trauma to that foot. No recent treatment for that. No other complaints.  Review of Systems  Constitutional: Positive for chills.       Sweating   Gastrointestinal: Positive for abdominal pain and diarrhea.  Musculoskeletal: Positive for back pain and gait problem.  Skin:       Change in nails  All other systems reviewed and are negative.      Objective:   Physical Exam General: AAO x3, NAD  Dermatological: There is evidence of incurvation of both the left and right medial nail border and there is tenderness to palpation. There is less edema to the area any erythema or increase in warmth. There is no drainage or pus identified. Diffuse hyperkeratotic lesions right foot submetatarsal as well as the left foot. Mostly submetatarsal 2 and 5 bilaterally. No underlying ulceration, drainage or any signs of infection. No open lesions identified.  Vascular: Dorsalis Pedis artery and Posterior Tibial artery pedal pulses are 2/4 bilateral with immedate capillary fill time. Pedal hair growth present. No varicosities and no lower extremity edema present bilateral. There is no pain with calf compression, swelling, warmth,  erythema.   Neruologic: Grossly intact via light touch bilateral. Vibratory intact via tuning fork bilateral. Protective threshold with Semmes Wienstein monofilament intact to all pedal sites bilateral.   Musculoskeletal: There is mild localized edema to the right fifth toe. There is mild tenderness palpation. Ptosis in rectus position. No pain metatarsal. There is also a clicking sensation present on the second interspace the left foot. Neuroma is palpable. There is no area pinpoint tenderness or pain the vibratory sensation. MMT 5/5. Range of motion intact. Prominence of the metatarsal heads plantarly with atrophy of fat pad.  Gait: Unassisted, Nonantalgic.     Assessment & Plan:  45 year old female presents the office for symptomatically medial hallux bilateral ingrown toenails, hyperkeratotic lesions, right fifth digit contusion, neuroma second interspace left foot -Treatment options discussed including all alternatives, risks, and complications -Etiology of symptoms were discussed -X-rays were obtained and reviewed with the patient. No evidence of acute fractures identified. -Splinting of the right fifth toe with buddy splinting. Discussed the head to this. Hyperkeratotic lesions debrided 4 without competitions or bleeding. Offloading pads were dispensed to take pressure off the metatarsal heads as well as the neuroma. Discussed possible steroid injection left second interspace and the near future if symptoms persist. -At this time, the patient is requesting partial nail removal with chemical matricectomy to the symptomatic portion of the nail. Risks and complications were discussed with the patient for which they understand and  verbally consent to the procedure. Under sterile conditions  a total of 3 mL of a mixture of 2% lidocaine plain and 0.5% Marcaine plain was infiltrated in a hallux block fashion. Once anesthetized, the skin was prepped in sterile fashion. A tourniquet was then applied.  Next the medial aspect of hallux nail border was then sharply excised making sure to remove the entire offending nail border. Once the nails were ensured to be removed area was debrided and the underlying skin was intact. There is no purulence identified in the procedure. Next phenol was then applied under standard conditions and copiously irrigated. Silvadene was applied. A dry sterile dressing was applied. After application of the dressing the tourniquet was removed and there is found to be an immediate capillary refill time to the digit. The patient tolerated the procedure well any complications. Post procedure instructions were discussed the patient for which he verbally understood. Follow-up in one week for nail check or sooner if any problems are to arise. Discussed signs/symptoms of infection and directed to call the office immediately should any occur or go directly to the emergency room. In the meantime, encouraged to call the office with any questions, concerns, changes symptoms.  Celesta Gentile, DPM

## 2015-11-30 NOTE — Patient Instructions (Signed)

## 2015-12-11 ENCOUNTER — Encounter: Payer: Self-pay | Admitting: Podiatry

## 2015-12-11 ENCOUNTER — Ambulatory Visit (INDEPENDENT_AMBULATORY_CARE_PROVIDER_SITE_OTHER): Payer: BLUE CROSS/BLUE SHIELD | Admitting: Podiatry

## 2015-12-11 DIAGNOSIS — Z9889 Other specified postprocedural states: Secondary | ICD-10-CM

## 2015-12-11 NOTE — Patient Instructions (Signed)

## 2015-12-11 NOTE — Progress Notes (Addendum)
Subjective: Tanya Kelley is a 45 y.o.  female returns to office today for follow up evaluation after having bilateral Hallux partial  nail avulsion performed. Patient has been soaking using epsom salts and applying topical antibiotic covered with bandaid daily. Patient denies fevers, chills, nausea, vomiting. Denies any calf pain, chest pain, SOB.   Objective:  Vitals: Reviewed  General: Well developed, nourished, in no acute distress, alert and oriented x3   Dermatology: Skin is warm, dry and supple bilateral. Bilateral hallux nail border appears to be clean, dry, with mild granular tissue and surrounding scab. There is no surrounding erythema, edema, drainage/purulence. The remaining nails appear unremarkable at this time. There are no other lesions or other signs of infection present.  Neurovascular status: Intact. No lower extremity swelling; No pain with calf compression bilateral.  Musculoskeletal: Decreased tenderness to palpation of the hallux nail folds. Muscular strength within normal limits bilateral.   Assesement and Plan: S/p partial nail avulsion, doing well.   -Continue soaking in epsom salts twice a day followed by antibiotic ointment and a band-aid. Can leave uncovered at night. Continue this until completely healed.  -If the area has not healed in 2 weeks, call the office for follow-up appointment, or sooner if any problems arise.  -Nail culture results discussed- prescribed urea though [facility].  -Follow-up in 3-4 weeks for possible injection for neuroma and for calluses -Monitor for any signs/symptoms of infection. Call the office immediately if any occur or go directly to the emergency room. Call with any questions/concerns.  Celesta Gentile, DPM

## 2015-12-25 ENCOUNTER — Ambulatory Visit: Payer: BLUE CROSS/BLUE SHIELD | Admitting: Podiatry

## 2016-01-08 ENCOUNTER — Ambulatory Visit (HOSPITAL_BASED_OUTPATIENT_CLINIC_OR_DEPARTMENT_OTHER)
Admission: RE | Admit: 2016-01-08 | Discharge: 2016-01-08 | Disposition: A | Discharge status: Home / Self Care | Payer: BLUE CROSS/BLUE SHIELD | Source: Ambulatory Visit | Attending: Family Medicine | Admitting: Family Medicine

## 2016-01-08 ENCOUNTER — Ambulatory Visit (INDEPENDENT_AMBULATORY_CARE_PROVIDER_SITE_OTHER): Payer: BLUE CROSS/BLUE SHIELD | Admitting: Podiatry

## 2016-01-08 ENCOUNTER — Encounter: Payer: Self-pay | Admitting: Podiatry

## 2016-01-08 ENCOUNTER — Ambulatory Visit (HOSPITAL_BASED_OUTPATIENT_CLINIC_OR_DEPARTMENT_OTHER)
Admission: RE | Admit: 2016-01-08 | Discharge: 2016-01-08 | Disposition: A | Discharge status: Home / Self Care | Payer: BLUE CROSS/BLUE SHIELD | Source: Ambulatory Visit | Attending: Podiatry | Admitting: Podiatry

## 2016-01-08 ENCOUNTER — Other Ambulatory Visit: Payer: Self-pay | Admitting: Family Medicine

## 2016-01-08 DIAGNOSIS — D3610 Benign neoplasm of peripheral nerves and autonomic nervous system, unspecified: Secondary | ICD-10-CM

## 2016-01-08 DIAGNOSIS — R52 Pain, unspecified: Secondary | ICD-10-CM

## 2016-01-08 DIAGNOSIS — Z1231 Encounter for screening mammogram for malignant neoplasm of breast: Secondary | ICD-10-CM | POA: Diagnosis present

## 2016-01-08 DIAGNOSIS — L84 Corns and callosities: Secondary | ICD-10-CM

## 2016-01-08 DIAGNOSIS — S92524D Nondisplaced fracture of medial phalanx of right lesser toe(s), subsequent encounter for fracture with routine healing: Secondary | ICD-10-CM

## 2016-01-08 DIAGNOSIS — M79671 Pain in right foot: Secondary | ICD-10-CM | POA: Insufficient documentation

## 2016-01-10 ENCOUNTER — Encounter: Payer: Self-pay | Admitting: Podiatry

## 2016-01-10 NOTE — Progress Notes (Signed)
Subjective: 45 year old female presents the office today for concerns of neuromas to both of her feet and she points in between her second third toes. She states that she can feel some popping and has difficulty walking she has numbness and tingling to the second and third toes. No swelling or redness. She has has calluses to both of her feet that should have trimmed. The nail procedure sites are doing well and no drainage or redness. Right fifth toe is still hurting at times however it is improving. Denies any systemic complaints such as fevers, chills, nausea, vomiting. No acute changes since last appointment, and no other complaints at this time.   Objective: AAO x3, NAD DP/PT pulses palpable bilaterally, CRT less than 3 seconds Hyperkeratotic lesions present bilateral submetatarsal 5. No underlying ulceration, drainage or signs of infection. There is tenderness the second interspaces bilaterally and a probable neuroma is identified. Clicking sensation is present. There is subjective numbness and tingling into the second third toes upon palpation interspaces. There is no area pinpoint tenderness or pain the vibratory sensation. Tenderness of the right fifth toe continues. There is minimal edema there is no erythema or increase in warmth. There is no open lesion identified. No clinical signs of infection. Ptosis in rectus position. Nail procedure sites are healing well and there is no pain. There is no edema, erythema, drainage. No open lesions or pre-ulcerative lesions.  No pain with calf compression, swelling, warmth, erythema  Assessment: Bilateral hyperkeratotic lesions, bilateral neuroma, right fifth toe fracture   Plan: -All treatment options discussed with the patient including all alternatives, risks, complications.  -Continue to buddy tape the right fifth toe. Will order an x-ray today. -Hyperkeratotic lesions debrided 2 without complications or bleeding. Offloading pads. -Discussed  the steroid injection to the bilateral second interspaces along the area of maximal tenderness and she wishes to proceed understanding risks and complications. Under sterile conditions a mixture of Kenalog and local anesthetic was infiltrated without complications. Post injection care was discussed. Neuroma pads. Discussed orthotics. -Follow-up as scheduled or sooner if needed. -Patient encouraged to call the office with any questions, concerns, change in symptoms.   Celesta Gentile, DPM

## 2016-01-11 ENCOUNTER — Telehealth: Payer: Self-pay | Admitting: *Deleted

## 2016-01-11 DIAGNOSIS — D3610 Benign neoplasm of peripheral nerves and autonomic nervous system, unspecified: Secondary | ICD-10-CM

## 2016-01-11 DIAGNOSIS — R52 Pain, unspecified: Secondary | ICD-10-CM

## 2016-01-11 NOTE — Telephone Encounter (Addendum)
-----  Message from Trula Slade, DPM sent at 01/09/2016  2:16 PM EST ----- No clinical signs of infection. Val- can you order a CBC with dif, uric acid, ESR, CRP? Thank you. 01/11/2016-Informed pt of Dr.Wagoner's review and orders. Pt states she will go today, and I gave her the Physician Specialty Coordinator [facility] [phone] for specifics on the labs location

## 2016-01-11 NOTE — Telephone Encounter (Signed)
-----  Message from Trula Slade, DPM sent at 01/09/2016  2:16 PM EST ----- No clinical signs of infection. Val- can you order a CBC with dif, uric acid, ESR, CRP? Thank you.

## 2016-02-12 ENCOUNTER — Ambulatory Visit: Payer: BLUE CROSS/BLUE SHIELD | Admitting: Podiatry

## 2016-02-26 ENCOUNTER — Ambulatory Visit: Payer: BLUE CROSS/BLUE SHIELD | Admitting: Podiatry

## 2016-03-07 ENCOUNTER — Encounter: Payer: Self-pay | Admitting: Family Medicine

## 2016-03-07 ENCOUNTER — Ambulatory Visit (INDEPENDENT_AMBULATORY_CARE_PROVIDER_SITE_OTHER): Payer: BLUE CROSS/BLUE SHIELD

## 2016-03-07 ENCOUNTER — Ambulatory Visit (INDEPENDENT_AMBULATORY_CARE_PROVIDER_SITE_OTHER): Payer: BLUE CROSS/BLUE SHIELD | Admitting: Family Medicine

## 2016-03-07 ENCOUNTER — Telehealth: Payer: Self-pay | Admitting: Family Medicine

## 2016-03-07 VITALS — BP 99/68 | HR 61 | Temp 98.0°F | Resp 20 | Wt 181.2 lb

## 2016-03-07 DIAGNOSIS — R05 Cough: Secondary | ICD-10-CM | POA: Diagnosis not present

## 2016-03-07 DIAGNOSIS — R079 Chest pain, unspecified: Secondary | ICD-10-CM | POA: Diagnosis not present

## 2016-03-07 DIAGNOSIS — R059 Cough, unspecified: Secondary | ICD-10-CM

## 2016-03-07 DIAGNOSIS — J209 Acute bronchitis, unspecified: Secondary | ICD-10-CM

## 2016-03-07 DIAGNOSIS — R6889 Other general symptoms and signs: Secondary | ICD-10-CM | POA: Diagnosis not present

## 2016-03-07 LAB — POC INFLUENZA A&B (BINAX/QUICKVUE)
INFLUENZA A, POC: NEGATIVE
INFLUENZA B, POC: NEGATIVE

## 2016-03-07 MED ORDER — DOXYCYCLINE HYCLATE 100 MG PO TABS: 100.0000 mg | ORAL_TABLET | Freq: Two times a day (BID) | ORAL | 0 refills | Status: DC

## 2016-03-07 NOTE — Progress Notes (Signed)
Tanya Kelley , 1970-12-20, 46 y.o., female MRN: [id number] Patient Care Team    Relationship Specialty Notifications Start End  Ma Hillock, DO PCP - General Family Medicine  04/04/15     CC: flu-like symptoms Subjective: Pt presents for an acute OV with complaints of flu-like symptoms of  2 days duration.  She reports the day after xmas coughing. Her husband was ill at that time. She has had a cough this entire time. The cough is now dry. Now she has body aches, weak, chills, headache. She is not UTD with flu shot.  No Known Allergies Social History  Substance Use Topics  . Smoking status: Current Some Day Smoker    Packs/day: 0.00    Years: 27.00    Types: Cigarettes  . Smokeless tobacco: Never Used     Comment: only smokes occasionally, with drinks  . Alcohol use Yes     Comment: occassional    Past Medical History:  Diagnosis Date  . ABDOMINAL BLOATING 04/10/2009   Qualifier: Diagnosis of  By: Nelson-Smith CMA (AAMA), Dottie    . Abdominal pain, unspecified site 04/10/2009   Qualifier: Diagnosis of  By: Nelson-Smith CMA (AAMA), Dottie    . ANEMIA, IRON DEFICIENCY 04/12/2009   Qualifier: Diagnosis of  By: Surface RN, Butch Penny    . B12 DEFICIENCY 04/12/2009   Qualifier: Diagnosis of  By: Surface RN, Butch Penny    . Barrett esophagus 2013  . CERVICAL RADICULOPATHY, RIGHT 06/26/2009   Qualifier: Diagnosis of  By: Lorelei Pont MD, Frederico Hamman    . Chest pain   . Colon polyps   . CONSTIPATION 04/10/2009   Qualifier: Diagnosis of  By: Nelson-Smith CMA (AAMA), Dottie    . COPD 04/10/2009   no per pt 05-25-15  . Diarrhea 04/10/2009   Qualifier: Diagnosis of  By: Nelson-Smith CMA (AAMA), Dottie    . Fatigue   . GERD (gastroesophageal reflux disease)   . Hot flashes   . Hyperlipidemia   . Jaw pain   . MELENA 04/10/2009   Qualifier: Diagnosis of  By: Nelson-Smith CMA (AAMA), Dottie    . Nausea   . SACROILIITIS, RIGHT 06/26/2009   Qualifier: Diagnosis of  By: Lorelei Pont MD, Frederico Hamman    .  Shoulder pain   . WEIGHT LOSS 04/10/2009   Qualifier: Diagnosis of  By: Harlon Ditty CMA (AAMA), Dottie     Past Surgical History:  Procedure Laterality Date  . CHOLECYSTECTOMY    . COLONOSCOPY    . FOOT SURGERY Bilateral   . TUBAL LIGATION    . UPPER GASTROINTESTINAL ENDOSCOPY     Family History  Problem Relation Age of Onset  . Hypertension Father   . Clotting disorder Father   . Colon polyps Father   . Healthy Mother   . Healthy Brother   . Heart attack Paternal Grandfather   . Heart disease Paternal Grandfather   . Early death Paternal Grandfather 63    MI  . Colon cancer Paternal Grandmother 41    alive  . Clotting disorder Paternal Grandmother   . Colon polyps Paternal Aunt     x 2  . Esophageal cancer Neg Hx   . Rectal cancer Neg Hx   . Stomach cancer Neg Hx    Allergies as of 03/07/2016   No Known Allergies     Medication List       Accurate as of 03/07/16  1:40 PM. Always use your most recent med list.  colestipol 1 g tablet Commonly known as:  COLESTID Take 1 tablet (1 g total) by mouth 2 (two) times daily.   dicyclomine 10 MG capsule Commonly known as:  BENTYL Take 1-2 capsules (10-20 mg total) by mouth every 8 (eight) hours as needed for spasms.   metoprolol tartrate 25 MG tablet Commonly known as:  LOPRESSOR Take 1 tablet (25 mg total) by mouth 2 (two) times daily.   naproxen 500 MG tablet Commonly known as:  NAPROSYN Take 1 tablet (500 mg total) by mouth 2 (two) times daily with a meal.   omeprazole 40 MG capsule Commonly known as:  PRILOSEC   valACYclovir 1000 MG tablet Commonly known as:  VALTREX Take 2 tabs with onset of symptoms and then 2 tabs in 12 hours PRN for cold sore       No results found for this or any previous visit (from the past 24 hour(s)). No results found.   ROS: Negative, with the exception of above mentioned in HPI   Objective:  BP 99/68 (BP Location: Left Arm, Patient Position: Sitting, Cuff Size:  Large)   Pulse 61   Temp 98 F (36.7 C)   Resp 20   Wt 181 lb 4 oz (82.2 kg)   SpO2 98%   BMI 28.39 kg/m  Body mass index is 28.39 kg/m. Gen: Afebrile. No acute distress. Nontoxic in appearance, well developed, well nourished.  HENT: AT. [place]. Bilateral TM visualized WNL. MMM, no oral lesions. Bilateral nares with mild erythema. Throat without erythema or exudates. Deep cough present. Hoarseness present.  Eyes:Pupils Equal Round Reactive to light, Extraocular movements intact,  Conjunctiva without redness, discharge or icterus. Neck/lymp/endocrine: Supple,mild ant cervical  lymphadenopathy CV: RRR  Chest: LLL crackles, mild rhonchi, no wheezing. Good air movement.  Abd: Soft. NTND. BS present   Results for orders placed or performed in visit on 03/07/16 (from the past 24 hour(s))  POC Influenza A&B (Binax test)     Status: Normal   Collection Time: 03/07/16  1:58 PM  Result Value Ref Range   Influenza A, POC Negative Negative   Influenza B, POC Negative Negative    Assessment/Plan: Tanya Kelley is a 46 y.o. female present for acute OV for  Flu-like symptoms - POC Influenza A&B (Binax test) Cough Acute bronchitis, unspecified organism - DG Chest 2 View; Future - rest, hydrate, mucinex DM - Doxy BID PRN.  - f/u as needed, unless xray positive for PNA then will follow in 2 weeks.   electronically signed by:  Howard Pouch, DO  [place]

## 2016-03-07 NOTE — Telephone Encounter (Signed)
Please call patient: Her chest x-ray does not show signs of pneumonia. 8 doxycycline for course. If not improved in 1-2 weeks follow-up, sooner if worsening.

## 2016-03-07 NOTE — Telephone Encounter (Signed)
Left message with results and detailed instructions on patient voice mail per [facility]

## 2016-03-07 NOTE — Patient Instructions (Signed)
Your flu test is negative today.   Please have xray completed at at [facility].  Rest, hydrate, flonase, mucinex DM.     Acute Bronchitis, Adult Acute bronchitis is when air tubes (bronchi) in the lungs suddenly get swollen. The condition can make it hard to breathe. It can also cause these symptoms:  A cough.  Coughing up clear, yellow, or green mucus.  Wheezing.  Chest congestion.  Shortness of breath.  A fever.  Body aches.  Chills.  A sore throat. Follow these instructions at home: Medicines  Take over-the-counter and prescription medicines only as told by your doctor.  If you were prescribed an antibiotic medicine, take it as told by your doctor. Do not stop taking the antibiotic even if you start to feel better. General instructions  Rest.  Drink enough fluids to keep your pee (urine) clear or pale yellow.  Avoid smoking and secondhand smoke. If you smoke and you need help quitting, ask your doctor. Quitting will help your lungs heal faster.  Use an inhaler, cool mist vaporizer, or humidifier as told by your doctor.  Keep all follow-up visits as told by your doctor. This is important. How is this prevented? To lower your risk of getting this condition again:  Wash your hands often with soap and water. If you cannot use soap and water, use hand sanitizer.  Avoid contact with people who have cold symptoms.  Try not to touch your hands to your mouth, nose, or eyes.  Make sure to get the flu shot every year. Contact a doctor if:  Your symptoms do not get better in 2 weeks. Get help right away if:  You cough up blood.  You have chest pain.  You have very bad shortness of breath.  You become dehydrated.  You faint (pass out) or keep feeling like you are going to pass out.  You keep throwing up (vomiting).  You have a very bad headache.  Your fever or chills gets worse. This information is not intended to replace advice given to you by  your health care provider. Make sure you discuss any questions you have with your health care provider. Document Released: 07/30/2007 Document Revised: 09/19/2015 Document Reviewed: 08/01/2015 Elsevier Interactive Patient Education  2017 [employer].

## 2016-04-17 ENCOUNTER — Encounter: Payer: Self-pay | Admitting: Family Medicine

## 2016-05-06 ENCOUNTER — Encounter: Payer: Self-pay | Admitting: Podiatry

## 2016-05-06 ENCOUNTER — Ambulatory Visit (INDEPENDENT_AMBULATORY_CARE_PROVIDER_SITE_OTHER): Payer: BLUE CROSS/BLUE SHIELD | Admitting: Podiatry

## 2016-05-06 DIAGNOSIS — M7742 Metatarsalgia, left foot: Secondary | ICD-10-CM | POA: Diagnosis not present

## 2016-05-06 DIAGNOSIS — D3610 Benign neoplasm of peripheral nerves and autonomic nervous system, unspecified: Secondary | ICD-10-CM

## 2016-05-06 DIAGNOSIS — M7741 Metatarsalgia, right foot: Secondary | ICD-10-CM | POA: Diagnosis not present

## 2016-05-06 MED ORDER — METHYLPREDNISOLONE 4 MG PO TBPK: ORAL_TABLET | ORAL | 0 refills | Status: DC

## 2016-05-06 NOTE — Progress Notes (Addendum)
Subjective: 46 year old female presents to the office today for concerns of continued right foot pain worse than the left. She states that she is doing well and her pain improved however the pain did worsen in the last couple of months. She states the injection did help with the pain is also removed and she points to the balls foot in between the third and fourth metatarsals. She does continue to get numbness and tingling  Her toes as well. She feels that she is walking on a piece of glass. She gets similar pian to the ball of the left foot but not as severe as the right. Denies any systemic complaints such as fevers, chills, nausea, vomiting. No acute changes since last appointment, and no other complaints at this time.   Objective: AAO x3, NAD DP/PT pulses palpable bilaterally, CRT less than 3 seconds There is tenderness on the second third interspace of the right foot. Fluid is palpable in the interspaces and there is a clicking sensation of the  Third and fourth MTPJ's plantarly. There is edema the plantar aspect of the metatarsal heads 2 through 4 on the right foot there is no associated erythema or increase in warmth. There is no area pinpoint bony tenderness and there is no pain vibratory sensation. No open lesions or pre-ulcerative lesions.  No pain with calf compression, swelling, warmth, erythema  Assessment: Continued right foot pain, metatarsalgia/bursitis rule out neuroma  Plan: -All treatment options discussed with the patient including all alternatives, risks, complications.  -Given as her symptoms been ongoing for several months despite treatment I recommended MRI of the right foot with contrast. This is ordered today. -Today a steroid injection was performed to the third interspace of the right foot without complications. Injection consisting of Kenalog and local anesthetic was infiltrated. Post injection care was discussed. -Neuroma pads dispensed -Prescribed medrol dose pack.  Discussed side effects of the medication and directed to stop if any are to occur and call the office.  -RTC after MRI or sooner if needed.  -Patient encouraged to call the office with any questions, concerns, change in symptoms.   Celesta Gentile, DPM

## 2016-05-07 ENCOUNTER — Telehealth: Payer: Self-pay | Admitting: *Deleted

## 2016-05-07 DIAGNOSIS — D3610 Benign neoplasm of peripheral nerves and autonomic nervous system, unspecified: Secondary | ICD-10-CM

## 2016-05-07 NOTE — Telephone Encounter (Addendum)
-----   Message from Trula Slade, DPM sent at 05/06/2016  4:09 PM EDT ----- Can you please order an MRI to rule out neuroma on the right foot. Please do with contrast. Thanks. 05/07/2016-Orders given to [facility] and faxed to [facility]. 05/08/2016-Pt states received a call from [facility] to schedule and she would like to have MRI performed at [facility]. I reviewed orders and her MRI order had been faxed to [facility], I apologized and faxed to [facility] scheduling [phone].

## 2016-05-19 ENCOUNTER — Other Ambulatory Visit: Payer: BLUE CROSS/BLUE SHIELD

## 2016-05-24 ENCOUNTER — Encounter (HOSPITAL_BASED_OUTPATIENT_CLINIC_OR_DEPARTMENT_OTHER): Payer: Self-pay

## 2016-05-24 ENCOUNTER — Ambulatory Visit (HOSPITAL_BASED_OUTPATIENT_CLINIC_OR_DEPARTMENT_OTHER)
Admission: RE | Admit: 2016-05-24 | Discharge: 2016-05-24 | Disposition: A | Discharge status: Home / Self Care | Payer: BLUE CROSS/BLUE SHIELD | Source: Ambulatory Visit | Attending: Podiatry | Admitting: Podiatry

## 2016-05-24 MED ORDER — GADOBENATE DIMEGLUMINE 529 MG/ML IV SOLN
15.0000 mL | Freq: Once | INTRAVENOUS | Status: DC | PRN
Start: 2016-05-24 — End: 2016-05-25

## 2016-05-27 ENCOUNTER — Other Ambulatory Visit: Payer: Self-pay | Admitting: Podiatry

## 2016-05-27 ENCOUNTER — Ambulatory Visit (HOSPITAL_BASED_OUTPATIENT_CLINIC_OR_DEPARTMENT_OTHER)
Admission: RE | Admit: 2016-05-27 | Discharge: 2016-05-27 | Disposition: A | Discharge status: Home / Self Care | Payer: BLUE CROSS/BLUE SHIELD | Source: Ambulatory Visit | Attending: Podiatry | Admitting: Podiatry

## 2016-05-27 DIAGNOSIS — M71571 Other bursitis, not elsewhere classified, right ankle and foot: Secondary | ICD-10-CM | POA: Insufficient documentation

## 2016-05-27 DIAGNOSIS — D3610 Benign neoplasm of peripheral nerves and autonomic nervous system, unspecified: Secondary | ICD-10-CM | POA: Diagnosis present

## 2016-06-03 ENCOUNTER — Ambulatory Visit: Payer: BLUE CROSS/BLUE SHIELD | Admitting: Podiatry

## 2016-06-10 ENCOUNTER — Ambulatory Visit (INDEPENDENT_AMBULATORY_CARE_PROVIDER_SITE_OTHER): Payer: BLUE CROSS/BLUE SHIELD | Admitting: Podiatry

## 2016-06-10 ENCOUNTER — Encounter: Payer: Self-pay | Admitting: Podiatry

## 2016-06-10 DIAGNOSIS — M7752 Other enthesopathy of left foot: Secondary | ICD-10-CM

## 2016-06-10 DIAGNOSIS — M7751 Other enthesopathy of right foot: Secondary | ICD-10-CM | POA: Diagnosis not present

## 2016-06-10 DIAGNOSIS — D3610 Benign neoplasm of peripheral nerves and autonomic nervous system, unspecified: Secondary | ICD-10-CM

## 2016-06-10 NOTE — Patient Instructions (Signed)

## 2016-06-14 NOTE — Progress Notes (Signed)
Subjective: 46 year old female presents the office discussed MRI results. She states that her foot pain on the right side is the same and it is gone to the point where she cannot do daily activities because of pain in the sharp pain that she gets to her toes. She states that she has difficulty even with the grocery store without pain to her toes and to the ball of her foot. She feels that she is walking on a piece of glass at times. Denies any systemic complaints such as fevers, chills, nausea, vomiting. No acute changes since last appointment, and no other complaints at this time.   Objective: AAO x3, NAD DP/PT pulses palpable bilaterally, CRT less than 3 seconds Majority tenderness appears to be along the second interspace and the right foot and there is a palpable neuroma present at this time. This is also subjective numbness and tingling sharp pains in the second third toes and to a lesser degree the third and fourth toes. There is some minimal discomfort third interspace and the right side as well. She is also starting to get some pain in the left second interspace but not as bad as the right side. There is no area pinpoint bony tenderness or pain with vibratory sensation. No open lesions or pre-ulcerative lesions.  No pain with calf compression, swelling, warmth, erythema  Assessment: Foot second interspace neuroma, bursitis  Plan: -All treatment options discussed with the patient including all alternatives, risks, complications.  -MRI results were discussed with the patient. See below. -At this time a discussed both conservative and surgical treatment options. This time we've exhausted multiple conservative treatments and she wishes to proceed with surgical intervention. -Will proceed with a  right second interspace neurectomy, steroid injection the right third interspace and left second interspace. -The incision placement as well as the postoperative course was discussed with the patient. I  discussed risks of the surgery which include, but not limited to, infection, bleeding, pain, swelling, need for further surgery, delayed or nonhealing, painful or ugly scar, numbness or sensation changes, over/under correction, recurrence, transfer lesions, further deformity, hardware failure, DVT/PE, stump neuroma, loss of toe/foot. Patient understands these risks and wishes to proceed with surgery. The surgical consent was reviewed with the patient all 3 pages were signed. No promises or guarantees were given to the outcome of the procedure. All questions were answered to the best of my ability. Before the surgery the patient was encouraged to call the office if there is any further questions. The surgery will be performed at the [facility] on an outpatient basis.  MRI 05/27/2016 IMPRESSION: Findings most consistent with a Morton's neuroma positioned between the dorsal aspect of the second and third metatarsal heads with associated intermetatarsal bursitis.  Fluid between the third and fourth metatarsal heads consistent with intermetatarsal bursitis. Negative for neuroma in this location.  Celesta Gentile, DPM

## 2016-06-17 ENCOUNTER — Telehealth: Payer: Self-pay | Admitting: *Deleted

## 2016-06-17 NOTE — Telephone Encounter (Signed)
No problem. Thanks 

## 2016-06-17 NOTE — Telephone Encounter (Signed)
"  I am scheduled for surgery May 2nd, I think.  I haven't talked to you about insurance.  I talked to my family and I'm going to have to put it off until the first of July.  Give me a call to discuss that."  I attempted to return her call.  I left her a message to call me back.

## 2016-06-17 NOTE — Telephone Encounter (Signed)
"  I am sorry I missed your call I was busy doing something.  I don't know why I was scheduled for tomorrow.  I had told Lattie Haw when she offered me that date that I couldn't because my daughter is about to get married.  I have another daughter that is getting ready to move in with me.  So, I want to hold off to July.  I will call back to reschedule it.  Is there anyway I can find out how much my insurance will pay?"  I can get Jocelyn Lamer to give you a call with that information.  "Okay, I'll give you a call in June to reschedule.  (Neurectomy 2nd rt and Steroid injection x 2)

## 2016-06-24 ENCOUNTER — Encounter: Payer: BLUE CROSS/BLUE SHIELD | Admitting: Podiatry

## 2016-12-01 ENCOUNTER — Other Ambulatory Visit: Payer: Self-pay | Admitting: Family Medicine

## 2016-12-01 DIAGNOSIS — Z1231 Encounter for screening mammogram for malignant neoplasm of breast: Secondary | ICD-10-CM

## 2016-12-05 ENCOUNTER — Emergency Department
Admission: EM | Admit: 2016-12-05 | Discharge: 2016-12-05 | Disposition: A | Discharge status: Home / Self Care | Payer: BLUE CROSS/BLUE SHIELD | Source: Home / Self Care | Attending: Emergency Medicine | Admitting: Emergency Medicine

## 2016-12-05 ENCOUNTER — Encounter: Payer: Self-pay | Admitting: *Deleted

## 2016-12-05 ENCOUNTER — Emergency Department: Payer: BLUE CROSS/BLUE SHIELD

## 2016-12-05 DIAGNOSIS — I8001 Phlebitis and thrombophlebitis of superficial vessels of right lower extremity: Secondary | ICD-10-CM | POA: Diagnosis not present

## 2016-12-05 MED ORDER — IBUPROFEN 600 MG PO TABS: 600.0000 mg | ORAL_TABLET | Freq: Four times a day (QID) | ORAL | 0 refills | Status: DC | PRN

## 2016-12-05 NOTE — ED Triage Notes (Signed)
Patient reports 2 knots developing on right lower extremity today. She has h/o varicose veins that are typically raised and reports they are flat today.

## 2016-12-05 NOTE — ED Provider Notes (Signed)
[name]    CSN: [id number] Arrival date & time: 12/05/16  1328     History   Chief Complaint Chief Complaint  Patient presents with  . Leg Swelling  . Knot in Leg    HPI Tanya Kelley is a 46 y.o. female.   The history is provided by the patient.  Leg Pain  Location:  Leg Injury: no   Leg location:  R upper leg and R lower leg Pain details:    Quality:  Aching   Severity:  Moderate   Duration:  1 day   Progression:  Worsening Chronicity:  New (Although she's had chronic varicose veins right leg) Prior injury to area:  No Relieved by:  Rest Exacerbated by: touching, pressing. Risk factors: no frequent fractures and no recent illness     Past Medical History:  Diagnosis Date  . ABDOMINAL BLOATING 04/10/2009   Qualifier: Diagnosis of  By: Nelson-Smith CMA (AAMA), Dottie    . Abdominal pain, unspecified site 04/10/2009   Qualifier: Diagnosis of  By: Nelson-Smith CMA (AAMA), Dottie    . ANEMIA, IRON DEFICIENCY 04/12/2009   Qualifier: Diagnosis of  By: Surface RN, Butch Penny    . B12 DEFICIENCY 04/12/2009   Qualifier: Diagnosis of  By: Surface RN, Butch Penny    . Barrett esophagus 2013  . CERVICAL RADICULOPATHY, RIGHT 06/26/2009   Qualifier: Diagnosis of  By: Lorelei Pont MD, Frederico Hamman    . Chest pain   . Colon polyps   . CONSTIPATION 04/10/2009   Qualifier: Diagnosis of  By: Nelson-Smith CMA (AAMA), Dottie    . COPD 04/10/2009   no per pt 05-25-15  . Diarrhea 04/10/2009   Qualifier: Diagnosis of  By: Nelson-Smith CMA (AAMA), Dottie    . Fatigue   . GERD (gastroesophageal reflux disease)   . Hot flashes   . Hyperlipidemia   . Jaw pain   . MELENA 04/10/2009   Qualifier: Diagnosis of  By: Nelson-Smith CMA (AAMA), Dottie    . Nausea   . SACROILIITIS, RIGHT 06/26/2009   Qualifier: Diagnosis of  By: Lorelei Pont MD, Frederico Hamman    . Shoulder pain   . WEIGHT LOSS 04/10/2009   Qualifier: Diagnosis of  By: Harlon Ditty CMA (AAMA), Dottie      Patient Active Problem List   Diagnosis Date Noted  . Right knee pain 11/19/2015  . Effusion of bursa of right knee 11/19/2015  . Maxillary sinusitis, acute 05/18/2015  . Lymphadenitis 05/18/2015  . 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 Hyperlipidemia LDL goal <100 04/09/2015  . Vitamin D deficiency 04/09/2015  . Tobacco use disorder 04/06/2015  . Encounter to establish care 04/06/2015  . Encounter for preventive health examination 04/06/2015  . Barrett esophagus   . Chest pain   . Hot flashes     Past Surgical History:  Procedure Laterality Date  . CHOLECYSTECTOMY    . COLONOSCOPY    . FOOT SURGERY Bilateral   . TUBAL LIGATION    . UPPER GASTROINTESTINAL ENDOSCOPY      OB History    Gravida Para Term Preterm AB Living   3 3       3    SAB TAB Ectopic Multiple Live Births                   Home Medications    Prior to Admission medications   Medication Sig Start Date End Date Taking? Authorizing Provider  colestipol (COLESTID) 1 g tablet Take 1 tablet (1 g total) by  mouth 2 (two) times daily. 05/18/15   Yetta Flock, MD  omeprazole (PRILOSEC) 40 MG capsule  02/22/16   [provider]  valACYclovir (VALTREX) 1000 MG tablet Take 2 tabs with onset of symptoms and then 2 tabs in 12 hours PRN for cold sore 05/18/15   Kuneff, Renee A, DO    Family History Family History  Problem Relation Age of Onset  . Hypertension Father   . Clotting disorder Father   . Colon polyps Father   . Healthy Mother   . Healthy Brother   . Heart attack Paternal Grandfather   . Heart disease Paternal Grandfather   . Early death Paternal Grandfather 73       MI  . Colon cancer Paternal Grandmother 72       alive  . Clotting disorder Paternal Grandmother   . Colon polyps Paternal Aunt        x 2  . Esophageal cancer Neg Hx   . Rectal cancer Neg Hx   . Stomach cancer Neg Hx     Social History Social History  Substance Use Topics  . Smoking status: Current Some Day Smoker    Packs/day: 0.00     Years: 27.00    Types: Cigarettes  . Smokeless tobacco: Never Used     Comment: only smokes occasionally, with drinks  . Alcohol use Yes     Comment: occassional      Allergies   Patient has no known allergies.   Review of Systems Review of Systems  Respiratory: Negative for shortness of breath.   Cardiovascular: Positive for leg swelling (Mild, right). Negative for chest pain and palpitations.  Gastrointestinal: Negative for abdominal pain.  Neurological: Negative for syncope, speech difficulty and light-headedness.  All other systems reviewed and are negative.    Physical Exam Triage Vital Signs ED Triage Vitals [12/05/16 1351]  Enc Vitals Group     BP 111/78     Pulse Rate (!) 106     Resp 14     Temp 98.6 F (37 C)     Temp Source Oral     SpO2 98 %     Weight 159 lb (72.1 kg)     Height      Head Circumference      Peak Flow      Pain Score 2     Pain Loc      Pain Edu?      Excl. in [place]?    No data found.   Updated Vital Signs BP 111/78 (BP Location: Left Arm)   Pulse (!) 106   Temp 98.6 F (37 C) (Oral)   Resp 14   Wt 159 lb (72.1 kg)   SpO2 98%   BMI 24.90 kg/m   Visual Acuity Right Eye Distance:   Left Eye Distance:   Bilateral Distance:    Right Eye Near:   Left Eye Near:    Bilateral Near:     Physical Exam  Constitutional: She is oriented to person, place, and time. She appears well-developed and well-nourished. No distress.  HENT:  Head: Normocephalic and atraumatic.  Eyes: Pupils are equal, round, and reactive to light. No scleral icterus.  Neck: Normal range of motion. Neck supple.  Cardiovascular: Normal rate and regular rhythm.   Pulmonary/Chest: Effort normal and breath sounds normal.  Abdominal: She exhibits no distension.  Musculoskeletal:       Right lower leg: She exhibits tenderness and swelling. She exhibits no  bony tenderness.       Legs: Neurological: She is alert and oriented to person, place, and time. No cranial  nerve deficit.  Skin: Skin is warm and dry. Capillary refill takes less than 2 seconds.  Psychiatric: She has a normal mood and affect. Her behavior is normal.  Vitals reviewed. As depicted, severe swelling, tenderness, induration, cords along the veins right lower extremity.. Trace right tibial edema. No definite calf tenderness. Neurovascular distally intact. DP pulses intact.   UC Treatments / Results  Labs (all labs ordered are listed, but only abnormal results are displayed) Labs Reviewed - No data to display  EKG  EKG Interpretation None       Radiology No results found.  Procedures Procedures (including critical care time)  Medications Ordered in UC Medications - No data to display   Initial Impression / Assessment and Plan / UC Course  I have reviewed the triage vital signs and the nursing notes.  Pertinent labs & imaging results that were available during my care of the patient were reviewed by me and considered in my medical decision making (see chart for details).  Clinical Course as of Dec 06 1407  Fri Dec 05, 2016  1408 I strongly suspect right lower extremity DVT. Right venous ultrasound ordered. Currently, it's change of provider shifts, and I have signed out patient's case to Marcene Brawn  [DM]    Clinical Course User Index [DM] Jacqulyn Cane, MD    The remainder of care and notes will be documented by Marcene Brawn  Final Clinical Impressions(s) / UC Diagnoses   Final diagnoses:  None    New Prescriptions New Prescriptions   No medications on file      Jacqulyn Cane, MD 12/05/16 1410

## 2016-12-05 NOTE — Discharge Instructions (Signed)
See your Physician for recheck in 3-4 days °

## 2016-12-05 NOTE — ED Provider Notes (Signed)
Pt's care assumed from Dr. Burnett Harry.  Vascular ultrasound obtained to evaluated from possible DVT.   Ultrasound shows no evidence of deep vein thrombosis.  Pt counseled on result and treated.   Pt advised elevate, warm compresses,  Pt given rx for ibuprofen.  Pt advised of need to return if fever or chills.  Pt counseled on thrombophlebitis.  An After Visit Summary was printed and given to the patient. Meds ordered this encounter  Medications  . ibuprofen (ADVIL,MOTRIN) 600 MG tablet    Sig: Take 1 tablet (600 mg total) by mouth every 6 (six) hours as needed.    Dispense:  30 tablet    Refill:  0    Order Specific Question:   Supervising Provider    Answer:   Burnett Harry, DAVID [[id number]]      Fransico Meadow, PA-C 12/05/16 1616

## 2016-12-07 ENCOUNTER — Telehealth: Payer: Self-pay | Admitting: Emergency Medicine

## 2016-12-07 NOTE — Telephone Encounter (Signed)
Patient informed of Negative Results. Advised to call back with questions or concerns.

## 2016-12-31 ENCOUNTER — Other Ambulatory Visit: Payer: Self-pay | Admitting: Orthopedic Surgery

## 2017-01-03 ENCOUNTER — Encounter: Payer: Self-pay | Admitting: Family Medicine

## 2017-01-05 ENCOUNTER — Other Ambulatory Visit: Payer: Self-pay | Admitting: *Deleted

## 2017-01-05 ENCOUNTER — Encounter: Payer: Self-pay | Admitting: Family Medicine

## 2017-01-05 ENCOUNTER — Ambulatory Visit: Payer: BLUE CROSS/BLUE SHIELD | Admitting: Family Medicine

## 2017-01-05 VITALS — BP 129/70 | HR 100 | Temp 98.1°F | Resp 20 | Wt 161.2 lb

## 2017-01-05 DIAGNOSIS — I889 Nonspecific lymphadenitis, unspecified: Secondary | ICD-10-CM | POA: Diagnosis not present

## 2017-01-05 DIAGNOSIS — R634 Abnormal weight loss: Secondary | ICD-10-CM

## 2017-01-05 DIAGNOSIS — B001 Herpesviral vesicular dermatitis: Secondary | ICD-10-CM

## 2017-01-05 DIAGNOSIS — N63 Unspecified lump in unspecified breast: Secondary | ICD-10-CM

## 2017-01-05 LAB — CBC WITH DIFFERENTIAL/PLATELET
BASOS ABS: 0.1 10*3/uL (ref 0.0–0.1)
Basophils Relative: 0.8 % (ref 0.0–3.0)
EOS ABS: 0.1 10*3/uL (ref 0.0–0.7)
Eosinophils Relative: 2.1 % (ref 0.0–5.0)
HCT: 40.7 % (ref 36.0–46.0)
Hemoglobin: 13.6 g/dL (ref 12.0–15.0)
LYMPHS ABS: 2.4 10*3/uL (ref 0.7–4.0)
Lymphocytes Relative: 33.3 % (ref 12.0–46.0)
MCHC: 33.3 g/dL (ref 30.0–36.0)
MCV: 91.1 fl (ref 78.0–100.0)
MONO ABS: 0.5 10*3/uL (ref 0.1–1.0)
MONOS PCT: 6.9 % (ref 3.0–12.0)
NEUTROS PCT: 56.9 % (ref 43.0–77.0)
Neutro Abs: 4.1 10*3/uL (ref 1.4–7.7)
PLATELETS: 191 10*3/uL (ref 150.0–400.0)
RBC: 4.47 Mil/uL (ref 3.87–5.11)
RDW: 12.9 % (ref 11.5–15.5)
WBC: 7.1 10*3/uL (ref 4.0–10.5)

## 2017-01-05 LAB — TSH: TSH: 1.2 u[IU]/mL (ref 0.35–4.50)

## 2017-01-05 MED ORDER — VALACYCLOVIR HCL 1 G PO TABS: ORAL_TABLET | ORAL | 3 refills | Status: DC

## 2017-01-05 NOTE — Patient Instructions (Signed)
We will collect labs for thyroid level and blood cell levels.   We ordered a mammogram for you.   We will call you once we receive all results.

## 2017-01-05 NOTE — Progress Notes (Signed)
Tanya Kelley , 11/11/1970, 46 y.o., female MRN: [id number] Patient Care Team    Relationship Specialty Notifications Start End  Ma Hillock, DO PCP - General Family Medicine  04/04/15     Chief Complaint  Patient presents with  . Breast Mass    right     Subjective: Pt presents for an OV with complaints of right breast mass she noticed last night. She reports she has a sharp pain in her breast and felt the lump under that pain. She reports unintentional weight loss since May of 20 lbs. Initially the weight loss was secondary to stress, but she has now noticed over the last few months she has no appetite. She denies fever, vomit, night sweats, fatigue, vaginal discharge or diarrhea. She endorses cold chills, occasional nasuea and constipation. She feels all her lymph nodes are swollen and tender, especially in her axilla, neck and groin. She wonders if she is just noticing the nodes because she is much thinner and can feel them. Her PAP was completed 2016 by GYN and pt reports it was normal, no records available.  Depression screen [facility] 2/9 01/05/2017 04/06/2015  Decreased Interest 0 0  Down, Depressed, Hopeless 0 0  PHQ - 2 Score 0 0    No Known Allergies Social History   Tobacco Use  . Smoking status: Current Some Day Smoker    Packs/day: 0.00    Years: 27.00    Pack years: 0.00    Types: Cigarettes  . Smokeless tobacco: Never Used  . Tobacco comment: only smokes occasionally, with drinks  Substance Use Topics  . Alcohol use: Yes    Comment: occassional    Past Medical History:  Diagnosis Date  . ABDOMINAL BLOATING 04/10/2009   Qualifier: Diagnosis of  By: Nelson-Smith CMA (AAMA), Dottie    . Abdominal pain, unspecified site 04/10/2009   Qualifier: Diagnosis of  By: Nelson-Smith CMA (AAMA), Dottie    . ANEMIA, IRON DEFICIENCY 04/12/2009   Qualifier: Diagnosis of  By: Surface RN, Butch Penny    . B12 DEFICIENCY 04/12/2009   Qualifier: Diagnosis of  By: Surface RN, Butch Penny      . Barrett esophagus 2013  . CERVICAL RADICULOPATHY, RIGHT 06/26/2009   Qualifier: Diagnosis of  By: Lorelei Pont MD, Frederico Hamman    . Chest pain   . Colon polyps   . CONSTIPATION 04/10/2009   Qualifier: Diagnosis of  By: Nelson-Smith CMA (AAMA), Dottie    . COPD 04/10/2009   no per pt 05-25-15  . Diarrhea 04/10/2009   Qualifier: Diagnosis of  By: Nelson-Smith CMA (AAMA), Dottie    . Fatigue   . GERD (gastroesophageal reflux disease)   . Hot flashes   . Hyperlipidemia   . Jaw pain   . MELENA 04/10/2009   Qualifier: Diagnosis of  By: Nelson-Smith CMA (AAMA), Dottie    . Nausea   . SACROILIITIS, RIGHT 06/26/2009   Qualifier: Diagnosis of  By: Lorelei Pont MD, Frederico Hamman    . Shoulder pain   . WEIGHT LOSS 04/10/2009   Qualifier: Diagnosis of  By: Harlon Ditty CMA (AAMA), Dottie     Past Surgical History:  Procedure Laterality Date  . CHOLECYSTECTOMY    . COLONOSCOPY    . FOOT SURGERY Bilateral   . TUBAL LIGATION    . UPPER GASTROINTESTINAL ENDOSCOPY     Family History  Problem Relation Age of Onset  . Hypertension Father   . Clotting disorder Father   . Colon polyps Father   .  Healthy Mother   . Healthy Brother   . Heart attack Paternal Grandfather   . Heart disease Paternal Grandfather   . Early death Paternal Grandfather 56       MI  . Colon cancer Paternal Grandmother 78       alive  . Clotting disorder Paternal Grandmother   . Colon polyps Paternal Aunt        x 2  . Esophageal cancer Neg Hx   . Rectal cancer Neg Hx   . Stomach cancer Neg Hx    Allergies as of 01/05/2017   No Known Allergies     Medication List        Accurate as of 01/05/17  8:55 AM. Always use your most recent med list.          ibuprofen 600 MG tablet Commonly known as:  ADVIL,MOTRIN Take 1 tablet (600 mg total) by mouth every 6 (six) hours as needed.   valACYclovir 1000 MG tablet Commonly known as:  VALTREX Take 2 tabs with onset of symptoms and then 2 tabs in 12 hours PRN for cold sore        All past medical history, surgical history, allergies, family history, immunizations andmedications were updated in the EMR today and reviewed under the history and medication portions of their EMR.     ROS: Negative, with the exception of above mentioned in HPI   Objective:  BP 129/70 (BP Location: Left Arm, Patient Position: Sitting, Cuff Size: Normal)   Pulse 100   Temp 98.1 F (36.7 C)   Resp 20   Wt 161 lb 4 oz (73.1 kg)   SpO2 97%   BMI 25.26 kg/m  Body mass index is 25.26 kg/m. Gen: Afebrile. No acute distress. Nontoxic in appearance, well developed, well nourished.  Breasts/groin: breasts appear normal, no suspicious masses, no skin or nipple changes or axillary nodes, symmetric fibrous changes in both upper outer quadrants, left breast normal without mass, skin or nipple changes or axillary nodes, right mass with fibrodensity areas, small mobile palpable mass 3' clock position about 1 in. from areola. No skin changes. No groin lymph nodes palpable.   No exam data present No results found. No results found for this or any previous visit (from the past 24 hour(s)).  Assessment/Plan: [name] is a 46 y.o. female present for OV for  Breast mass - Small palpable mass in area of discomfort. Will obtain diagnostic mammogram for her. With ultrasound if needed. - MM Digital Diagnostic Bilat; Future - US BREAST LTD UNI RIGHT INC AXILLA; Future - US BREAST LTD UNI LEFT INC AXILLA; Future  Lymphadenitis/Weight loss, unintentional - Lymphadenopathy with the highest today on exam. Will obtain TSH and CBC to be thorough giving weight loss. - BMI is normal. - CBC w/Diff - TSH - Follow-up dependent on results.   Reviewed expectations re: course of current medical issues.  Discussed self-management of symptoms.  Outlined signs and symptoms indicating need for more acute intervention.  Patient verbalized understanding and all questions were answered.  Patient  received an After-Visit Summary.    No orders of the defined types were placed in this encounter.    Note is dictated utilizing voice recognition software. Although note has been proof read prior to signing, occasional typographical errors still can be missed. If any questions arise, please do not hesitate to call for verification.   electronically signed by:  Howard Pouch, DO  [place]

## 2017-01-12 ENCOUNTER — Ambulatory Visit (HOSPITAL_BASED_OUTPATIENT_CLINIC_OR_DEPARTMENT_OTHER): Payer: BLUE CROSS/BLUE SHIELD

## 2017-01-12 ENCOUNTER — Ambulatory Visit
Admission: RE | Admit: 2017-01-12 | Discharge: 2017-01-12 | Disposition: A | Discharge status: Home / Self Care | Payer: BLUE CROSS/BLUE SHIELD | Source: Ambulatory Visit | Attending: Family Medicine | Admitting: Family Medicine

## 2017-01-12 ENCOUNTER — Ambulatory Visit: Payer: BLUE CROSS/BLUE SHIELD

## 2017-01-12 ENCOUNTER — Other Ambulatory Visit: Payer: Self-pay | Admitting: Family Medicine

## 2017-01-12 DIAGNOSIS — N63 Unspecified lump in unspecified breast: Secondary | ICD-10-CM

## 2017-01-14 ENCOUNTER — Ambulatory Visit (HOSPITAL_BASED_OUTPATIENT_CLINIC_OR_DEPARTMENT_OTHER): Payer: BLUE CROSS/BLUE SHIELD

## 2017-01-22 ENCOUNTER — Encounter (HOSPITAL_BASED_OUTPATIENT_CLINIC_OR_DEPARTMENT_OTHER): Payer: Self-pay | Admitting: *Deleted

## 2017-01-26 ENCOUNTER — Encounter (HOSPITAL_BASED_OUTPATIENT_CLINIC_OR_DEPARTMENT_OTHER): Payer: Self-pay | Admitting: *Deleted

## 2017-01-26 ENCOUNTER — Other Ambulatory Visit: Payer: Self-pay

## 2017-01-29 ENCOUNTER — Ambulatory Visit (HOSPITAL_BASED_OUTPATIENT_CLINIC_OR_DEPARTMENT_OTHER): Payer: BLUE CROSS/BLUE SHIELD | Admitting: Certified Registered"

## 2017-01-29 ENCOUNTER — Other Ambulatory Visit: Payer: Self-pay

## 2017-01-29 ENCOUNTER — Encounter (HOSPITAL_BASED_OUTPATIENT_CLINIC_OR_DEPARTMENT_OTHER)
Admission: RE | Disposition: A | Discharge status: Home / Self Care | Payer: Self-pay | Source: Ambulatory Visit | Attending: Orthopedic Surgery

## 2017-01-29 ENCOUNTER — Ambulatory Visit (HOSPITAL_BASED_OUTPATIENT_CLINIC_OR_DEPARTMENT_OTHER)
Admission: RE | Admit: 2017-01-29 | Discharge: 2017-01-29 | Disposition: A | Discharge status: Home / Self Care | Payer: BLUE CROSS/BLUE SHIELD | Source: Ambulatory Visit | Attending: Orthopedic Surgery | Admitting: Orthopedic Surgery

## 2017-01-29 ENCOUNTER — Encounter (HOSPITAL_BASED_OUTPATIENT_CLINIC_OR_DEPARTMENT_OTHER): Payer: Self-pay | Admitting: *Deleted

## 2017-01-29 DIAGNOSIS — E785 Hyperlipidemia, unspecified: Secondary | ICD-10-CM | POA: Diagnosis not present

## 2017-01-29 DIAGNOSIS — G5763 Lesion of plantar nerve, bilateral lower limbs: Secondary | ICD-10-CM | POA: Insufficient documentation

## 2017-01-29 DIAGNOSIS — Z87891 Personal history of nicotine dependence: Secondary | ICD-10-CM | POA: Diagnosis not present

## 2017-01-29 DIAGNOSIS — Z9889 Other specified postprocedural states: Secondary | ICD-10-CM

## 2017-01-29 DIAGNOSIS — K219 Gastro-esophageal reflux disease without esophagitis: Secondary | ICD-10-CM | POA: Diagnosis not present

## 2017-01-29 DIAGNOSIS — J449 Chronic obstructive pulmonary disease, unspecified: Secondary | ICD-10-CM | POA: Diagnosis not present

## 2017-01-29 DIAGNOSIS — Z79899 Other long term (current) drug therapy: Secondary | ICD-10-CM | POA: Insufficient documentation

## 2017-01-29 HISTORY — DX: Nausea with vomiting, unspecified: R11.2

## 2017-01-29 HISTORY — PX: WEIL OSTEOTOMY: SHX5044

## 2017-01-29 HISTORY — DX: Other specified postprocedural states: Z98.890

## 2017-01-29 HISTORY — PX: EXCISION MORTON'S NEUROMA: SHX5013

## 2017-01-29 SURGERY — EXCISION, MORTON'S NEUROMA
Anesthesia: General | Site: Foot | Laterality: Bilateral

## 2017-01-29 MED ORDER — SODIUM CHLORIDE 0.9 % IV SOLN: INTRAVENOUS | Status: DC

## 2017-01-29 MED ORDER — DOCUSATE SODIUM 100 MG PO CAPS: 100.0000 mg | ORAL_CAPSULE | Freq: Two times a day (BID) | ORAL | 0 refills | Status: DC

## 2017-01-29 MED ORDER — OXYCODONE HCL 5 MG PO TABS: 5.0000 mg | ORAL_TABLET | ORAL | 0 refills | Status: DC | PRN

## 2017-01-29 MED ORDER — 0.9 % SODIUM CHLORIDE (POUR BTL) OPTIME
TOPICAL | Status: DC | PRN
  Administered 2017-01-29: 200 mL

## 2017-01-29 MED ORDER — FENTANYL CITRATE (PF) 100 MCG/2ML IJ SOLN
25.0000 ug | INTRAMUSCULAR | Status: DC | PRN
  Administered 2017-01-29: 50 ug via INTRAVENOUS
  Administered 2017-01-29: 25 ug via INTRAVENOUS

## 2017-01-29 MED ORDER — CHLORHEXIDINE GLUCONATE 4 % EX LIQD: 60.0000 mL | Freq: Once | CUTANEOUS | Status: DC

## 2017-01-29 MED ORDER — LIDOCAINE 2% (20 MG/ML) 5 ML SYRINGE
INTRAMUSCULAR | Status: AC
  Filled 2017-01-29: qty 25

## 2017-01-29 MED ORDER — FENTANYL CITRATE (PF) 100 MCG/2ML IJ SOLN
INTRAMUSCULAR | Status: AC
  Filled 2017-01-29: qty 2

## 2017-01-29 MED ORDER — EPHEDRINE 5 MG/ML INJ
INTRAVENOUS | Status: AC
  Filled 2017-01-29: qty 50

## 2017-01-29 MED ORDER — DEXAMETHASONE SODIUM PHOSPHATE 10 MG/ML IJ SOLN
INTRAMUSCULAR | Status: AC
  Filled 2017-01-29: qty 5

## 2017-01-29 MED ORDER — MIDAZOLAM HCL 2 MG/2ML IJ SOLN
INTRAMUSCULAR | Status: AC
  Filled 2017-01-29: qty 2

## 2017-01-29 MED ORDER — CELECOXIB 200 MG PO CAPS
ORAL_CAPSULE | ORAL | Status: AC
  Filled 2017-01-29: qty 1

## 2017-01-29 MED ORDER — ONDANSETRON HCL 4 MG/2ML IJ SOLN
INTRAMUSCULAR | Status: AC
  Filled 2017-01-29: qty 20

## 2017-01-29 MED ORDER — SUCCINYLCHOLINE CHLORIDE 20 MG/ML IJ SOLN
INTRAMUSCULAR | Status: DC | PRN
  Administered 2017-01-29: 100 mg via INTRAVENOUS

## 2017-01-29 MED ORDER — LIDOCAINE HCL (CARDIAC) 20 MG/ML IV SOLN
INTRAVENOUS | Status: DC | PRN
  Administered 2017-01-29: 60 mg via INTRAVENOUS

## 2017-01-29 MED ORDER — SUCCINYLCHOLINE CHLORIDE 200 MG/10ML IV SOSY
PREFILLED_SYRINGE | INTRAVENOUS | Status: AC
  Filled 2017-01-29: qty 10

## 2017-01-29 MED ORDER — FENTANYL CITRATE (PF) 100 MCG/2ML IJ SOLN
INTRAMUSCULAR | Status: DC | PRN
  Administered 2017-01-29 (×2): 25 ug via INTRAVENOUS
  Administered 2017-01-29: 100 ug via INTRAVENOUS

## 2017-01-29 MED ORDER — ROCURONIUM BROMIDE 10 MG/ML (PF) SYRINGE
PREFILLED_SYRINGE | INTRAVENOUS | Status: AC
  Filled 2017-01-29: qty 5

## 2017-01-29 MED ORDER — ONDANSETRON HCL 4 MG/2ML IJ SOLN
INTRAMUSCULAR | Status: DC | PRN
  Administered 2017-01-29: 4 mg via INTRAVENOUS

## 2017-01-29 MED ORDER — PROPOFOL 500 MG/50ML IV EMUL
INTRAVENOUS | Status: AC
  Filled 2017-01-29: qty 100

## 2017-01-29 MED ORDER — BUPIVACAINE-EPINEPHRINE 0.5% -1:200000 IJ SOLN
INTRAMUSCULAR | Status: DC | PRN
  Administered 2017-01-29: 20 mL

## 2017-01-29 MED ORDER — CEFAZOLIN SODIUM-DEXTROSE 2-4 GM/100ML-% IV SOLN
INTRAVENOUS | Status: AC
  Filled 2017-01-29: qty 100

## 2017-01-29 MED ORDER — ACETAMINOPHEN 500 MG PO TABS
1000.0000 mg | ORAL_TABLET | Freq: Once | ORAL | Status: AC
  Administered 2017-01-29: 1000 mg via ORAL

## 2017-01-29 MED ORDER — GABAPENTIN 300 MG PO CAPS
ORAL_CAPSULE | ORAL | Status: AC
  Filled 2017-01-29: qty 1

## 2017-01-29 MED ORDER — GABAPENTIN 300 MG PO CAPS
300.0000 mg | ORAL_CAPSULE | Freq: Once | ORAL | Status: AC
  Administered 2017-01-29: 300 mg via ORAL

## 2017-01-29 MED ORDER — CELECOXIB 200 MG PO CAPS
200.0000 mg | ORAL_CAPSULE | Freq: Once | ORAL | Status: AC
  Administered 2017-01-29: 200 mg via ORAL

## 2017-01-29 MED ORDER — OXYCODONE HCL 5 MG PO TABS
ORAL_TABLET | ORAL | Status: AC
  Filled 2017-01-29: qty 1

## 2017-01-29 MED ORDER — MIDAZOLAM HCL 5 MG/5ML IJ SOLN
INTRAMUSCULAR | Status: DC | PRN
  Administered 2017-01-29: 2 mg via INTRAVENOUS

## 2017-01-29 MED ORDER — SCOPOLAMINE 1 MG/3DAYS TD PT72
1.0000 | MEDICATED_PATCH | Freq: Once | TRANSDERMAL | Status: DC
  Administered 2017-01-29: 1.5 mg via TRANSDERMAL

## 2017-01-29 MED ORDER — DEXAMETHASONE SODIUM PHOSPHATE 10 MG/ML IJ SOLN
INTRAMUSCULAR | Status: DC | PRN
  Administered 2017-01-29: 10 mg via INTRAVENOUS

## 2017-01-29 MED ORDER — CEFAZOLIN SODIUM-DEXTROSE 2-4 GM/100ML-% IV SOLN
2.0000 g | INTRAVENOUS | Status: AC
  Administered 2017-01-29: 2 g via INTRAVENOUS

## 2017-01-29 MED ORDER — EPHEDRINE SULFATE 50 MG/ML IJ SOLN
INTRAMUSCULAR | Status: DC | PRN
  Administered 2017-01-29: 10 mg via INTRAVENOUS

## 2017-01-29 MED ORDER — LACTATED RINGERS IV SOLN
INTRAVENOUS | Status: DC | PRN
  Administered 2017-01-29 (×2): via INTRAVENOUS

## 2017-01-29 MED ORDER — SENNA 8.6 MG PO TABS
2.0000 | ORAL_TABLET | Freq: Two times a day (BID) | ORAL | 0 refills | Status: DC
Start: 2017-01-29 — End: 2018-01-01

## 2017-01-29 MED ORDER — PROMETHAZINE HCL 25 MG/ML IJ SOLN: 6.2500 mg | INTRAMUSCULAR | Status: DC | PRN

## 2017-01-29 MED ORDER — PROPOFOL 10 MG/ML IV BOLUS
INTRAVENOUS | Status: DC | PRN
  Administered 2017-01-29: 150 mg via INTRAVENOUS

## 2017-01-29 MED ORDER — SCOPOLAMINE 1 MG/3DAYS TD PT72
MEDICATED_PATCH | TRANSDERMAL | Status: AC
  Filled 2017-01-29: qty 1

## 2017-01-29 MED ORDER — OXYCODONE HCL 5 MG PO TABS
5.0000 mg | ORAL_TABLET | Freq: Once | ORAL | Status: AC | PRN
  Administered 2017-01-29: 5 mg via ORAL

## 2017-01-29 MED ORDER — ACETAMINOPHEN 500 MG PO TABS
ORAL_TABLET | ORAL | Status: AC
Start: 2017-01-29 — End: 2017-01-29
  Filled 2017-01-29: qty 2

## 2017-01-29 SURGICAL SUPPLY — 74 items
BANDAGE ESMARK 6X9 LF (GAUZE/BANDAGES/DRESSINGS) IMPLANT
BLADE AVERAGE 25MMX9MM (BLADE)
BLADE AVERAGE 25X9 (BLADE) IMPLANT
BLADE LONG MED 25X9 (BLADE) ×2 IMPLANT
BLADE LONG MED 25X9MM (BLADE) ×1
BLADE SURG 15 STRL LF DISP TIS (BLADE) ×2 IMPLANT
BLADE SURG 15 STRL SS (BLADE) ×6
BNDG CMPR 9X4 STRL LF SNTH (GAUZE/BANDAGES/DRESSINGS)
BNDG CMPR 9X6 STRL LF SNTH (GAUZE/BANDAGES/DRESSINGS) ×1
BNDG COHESIVE 4X5 TAN STRL (GAUZE/BANDAGES/DRESSINGS) ×6 IMPLANT
BNDG CONFORM 2 STRL LF (GAUZE/BANDAGES/DRESSINGS) ×6 IMPLANT
BNDG CONFORM 3 STRL LF (GAUZE/BANDAGES/DRESSINGS) ×6 IMPLANT
BNDG ESMARK 4X9 LF (GAUZE/BANDAGES/DRESSINGS) IMPLANT
BNDG ESMARK 6X9 LF (GAUZE/BANDAGES/DRESSINGS) ×3
CHLORAPREP W/TINT 26ML (MISCELLANEOUS) ×6 IMPLANT
CLOSURE WOUND 1/2 X4 (GAUZE/BANDAGES/DRESSINGS)
CORD BIPOLAR FORCEPS 12FT (ELECTRODE) ×1 IMPLANT
COVER BACK TABLE 60X90IN (DRAPES) ×3 IMPLANT
CUFF TOURNIQUET SINGLE 18IN (TOURNIQUET CUFF) IMPLANT
CUFF TOURNIQUET SINGLE 24IN (TOURNIQUET CUFF) ×4 IMPLANT
CUFF TOURNIQUET SINGLE 34IN LL (TOURNIQUET CUFF) IMPLANT
DRAPE EXTREMITY BILATERAL (DRAPES) ×3 IMPLANT
DRAPE OEC MINIVIEW 54X84 (DRAPES) ×3 IMPLANT
DRAPE SURG 17X23 STRL (DRAPES) ×2 IMPLANT
DRAPE U-SHAPE 47X51 STRL (DRAPES) ×6 IMPLANT
DRSG MEPITEL 4X7.2 (GAUZE/BANDAGES/DRESSINGS) ×3 IMPLANT
DRSG PAD ABDOMINAL 8X10 ST (GAUZE/BANDAGES/DRESSINGS) ×3 IMPLANT
ELECT REM PT RETURN 9FT ADLT (ELECTROSURGICAL) ×3
ELECTRODE REM PT RTRN 9FT ADLT (ELECTROSURGICAL) ×1 IMPLANT
GAUZE SPONGE 4X4 12PLY STRL (GAUZE/BANDAGES/DRESSINGS) ×3 IMPLANT
GLOVE BIO SURGEON STRL SZ8 (GLOVE) ×5 IMPLANT
GLOVE BIOGEL PI IND STRL 7.0 (GLOVE) IMPLANT
GLOVE BIOGEL PI IND STRL 8 (GLOVE) ×2 IMPLANT
GLOVE BIOGEL PI INDICATOR 7.0 (GLOVE) ×4
GLOVE BIOGEL PI INDICATOR 8 (GLOVE) ×4
GLOVE ECLIPSE 6.5 STRL STRAW (GLOVE) ×2 IMPLANT
GLOVE ECLIPSE 8.0 STRL XLNG CF (GLOVE) ×3 IMPLANT
GOWN STRL REUS W/ TWL LRG LVL3 (GOWN DISPOSABLE) ×1 IMPLANT
GOWN STRL REUS W/ TWL XL LVL3 (GOWN DISPOSABLE) ×2 IMPLANT
GOWN STRL REUS W/TWL LRG LVL3 (GOWN DISPOSABLE) ×3
GOWN STRL REUS W/TWL XL LVL3 (GOWN DISPOSABLE) ×6
NDL HYPO 25X1 1.5 SAFETY (NEEDLE) IMPLANT
NEEDLE HYPO 22GX1.5 SAFETY (NEEDLE) IMPLANT
NEEDLE HYPO 25X1 1.5 SAFETY (NEEDLE) ×3 IMPLANT
NS IRRIG 1000ML POUR BTL (IV SOLUTION) ×3 IMPLANT
PACK BASIN DAY SURGERY FS (CUSTOM PROCEDURE TRAY) ×3 IMPLANT
PAD CAST 4YDX4 CTTN HI CHSV (CAST SUPPLIES) ×2 IMPLANT
PADDING CAST ABS 4INX4YD NS (CAST SUPPLIES)
PADDING CAST ABS COTTON 4X4 ST (CAST SUPPLIES) IMPLANT
PADDING CAST COTTON 4X4 STRL (CAST SUPPLIES) ×6
PASSER SUT SWANSON 36MM LOOP (INSTRUMENTS) IMPLANT
PENCIL BUTTON HOLSTER BLD 10FT (ELECTRODE) ×2 IMPLANT
SANITIZER HAND PURELL 535ML FO (MISCELLANEOUS) ×3 IMPLANT
SCREW HCS TWIST-OFF 2.0X12MM (Screw) ×4 IMPLANT
SHEET MEDIUM DRAPE 40X70 STRL (DRAPES) ×3 IMPLANT
SLEEVE SCD COMPRESS KNEE MED (MISCELLANEOUS) IMPLANT
SPONGE LAP 18X18 X RAY DECT (DISPOSABLE) ×3 IMPLANT
STOCKINETTE 6  STRL (DRAPES) ×4
STOCKINETTE 6 STRL (DRAPES) ×2 IMPLANT
STRIP CLOSURE SKIN 1/2X4 (GAUZE/BANDAGES/DRESSINGS) IMPLANT
SUCTION FRAZIER HANDLE 10FR (MISCELLANEOUS) ×2
SUCTION TUBE FRAZIER 10FR DISP (MISCELLANEOUS) IMPLANT
SUT ETHILON 3 0 PS 1 (SUTURE) ×5 IMPLANT
SUT MNCRL AB 3-0 PS2 18 (SUTURE) ×3 IMPLANT
SUT VIC AB 2-0 SH 27 (SUTURE) ×3
SUT VIC AB 2-0 SH 27XBRD (SUTURE) IMPLANT
SUT VICRYL 0 UR6 27IN ABS (SUTURE) IMPLANT
SYR BULB 3OZ (MISCELLANEOUS) ×3 IMPLANT
SYR CONTROL 10ML LL (SYRINGE) ×2 IMPLANT
TOWEL OR 17X24 6PK STRL BLUE (TOWEL DISPOSABLE) ×3 IMPLANT
TUBE CONNECTING 20'X1/4 (TUBING) ×1
TUBE CONNECTING 20X1/4 (TUBING) ×1 IMPLANT
UNDERPAD 30X30 (UNDERPADS AND DIAPERS) ×3 IMPLANT
YANKAUER SUCT BULB TIP NO VENT (SUCTIONS) IMPLANT

## 2017-01-29 NOTE — Discharge Instructions (Addendum)
May take tylenol at 10pm tonight if needed, then may repeat every 6 hours as needed.  Wylene Simmer, MD [place]  Please read the following information regarding your care after surgery.  Medications  You only need a prescription for the narcotic pain medicine (ex. oxycodone, Percocet, Norco).  All of the other medicines listed below are available over the counter. X Aleve 2 pills twice a day for the first 3 days after surgery. X acetominophen (Tylenol) 650 mg every 4-6 hours as you need for minor to moderate pain X oxycodone as prescribed for severe pain  Narcotic pain medicine (ex. oxycodone, Percocet, Vicodin) will cause constipation.  To prevent this problem, take the following medicines while you are taking any pain medicine. X docusate sodium (Colace) 100 mg twice a day X senna (Senokot) 2 tablets twice a day   Weight Bearing X Bear weight when you are able on your operated leg or foot in the flat post-op shoe.  Cast / Splint / Dressing X Keep your splint, cast or dressing clean and dry.  Dont put anything (coat hanger, pencil, etc) down inside of it.  If it gets damp, use a hair dryer on the cool setting to dry it.  If it gets soaked, call the office to schedule an appointment for a cast/dressing change.  After your dressing, cast or splint is removed; you may shower, but do not soak or scrub the wound.  Allow the water to run over it, and then gently pat it dry.  Swelling It is normal for you to have swelling where you had surgery.  To reduce swelling and pain, keep your toes above your nose for at least 3 days after surgery.  It may be necessary to keep your foot or leg elevated for several weeks.  If it hurts, it should be elevated.  Follow Up Call my office at [phone] when you are discharged from the hospital or surgery center to schedule an appointment to be seen two weeks after surgery.  Call my office at [phone] if you develop a fever >101.5 F,  nausea, vomiting, bleeding from the surgical site or severe pain.     Post Anesthesia Home Care Instructions  Activity: Get plenty of rest for the remainder of the day. A responsible individual must stay with you for 24 hours following the procedure.  For the next 24 hours, DO NOT: -Drive a car -Paediatric nurse -Drink alcoholic beverages -Take any medication unless instructed by your physician -Make any legal decisions or sign important papers.  Meals: Start with liquid foods such as gelatin or soup. Progress to regular foods as tolerated. Avoid greasy, spicy, heavy foods. If nausea and/or vomiting occur, drink only clear liquids until the nausea and/or vomiting subsides. Call your physician if vomiting continues.  Special Instructions/Symptoms: Your throat may feel dry or sore from the anesthesia or the breathing tube placed in your throat during surgery. If this causes discomfort, gargle with warm salt water. The discomfort should disappear within 24 hours.  If you had a scopolamine patch placed behind your ear for the management of post- operative nausea and/or vomiting:  1. The medication in the patch is effective for 72 hours, after which it should be removed.  Wrap patch in a tissue and discard in the trash. Wash hands thoroughly with soap and water. 2. You may remove the patch earlier than 72 hours if you experience unpleasant side effects which may include dry mouth, dizziness or visual disturbances. 3. Avoid  touching the patch. Wash your hands with soap and water after contact with the patch.

## 2017-01-29 NOTE — Op Note (Signed)
01/29/2017  11:18 AM  PATIENT:  Tanya Kelley  46 y.o. 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  Excision of right 2nd webspace Morton's neuroma 2.  Right 2nd MT Weil osteotomy 3.  Right foot AP and lateral xrays 4.  Excision of left 2nd webspace Morton's neuroma 5.  Left 2nd MT Weil osteotomy 6.  Left foot AP and lateral xrays   SURGEON:  Wylene Simmer, MD  ASSISTANT: Mechele Claude, PA-C  ANESTHESIA:   General, regional  EBL:  minimal   TOURNIQUET:   Total Tourniquet Time Documented: Calf (Right) - 25 minutes Total: Calf (Right) - 25 minutes  Calf (Left) - 18 minutes Total: Calf (Left) - 18 minutes  COMPLICATIONS:  None apparent  DISPOSITION:  Extubated, awake and stable to recovery.  INDICATION FOR PROCEDURE: The patient is a 46 year old woman with long history of forefoot pain.  Signs and symptoms are consistent with metatarsalgia and second webspace Morton's neuroma bilaterally.  She has failed nonoperative treatment to date including activity modification, oral anti-inflammatories, orthotics and shoe wear modification as well as steroid injection.  She presents today for operative treatment of these painful conditions.The risks and benefits of the alternative treatment options have been discussed in detail.  The patient wishes to proceed with surgery and specifically understands risks of bleeding, infection, nerve damage, blood clots, need for additional surgery, amputation and death.  PROCEDURE IN DETAIL:  After pre operative consent was obtained, and the correct operative site was identified, the patient was brought to the operating room and placed supine on the OR table.  Anesthesia was administered.  Pre-operative antibiotics were administered.  A surgical timeout was taken.  Both lower extremities were  prepped and draped in standard sterile fashion with tourniquets around each calf.  The right lower extremity was exsanguinated and the tourniquet was inflated to 200 mmHg.  A longitudinal incision was made at the dorsum of the second web space.  Dissection was carried down through the skin and subtendinous tissue.  The intermetatarsal ligament was divided under direct vision.  A Herndon retractor was placed between the metatarsal necks.  The interdigital nerve was identified.  It was noted to be quite thickened and hypertrophic distal to the ligament.  It was traced proximally and transected at the level of the muscles.  Distally it was traced past the bifurcation and transected at both toes.  It was removed in its entirety.  Wound was irrigated copiously.  Attention was then turned to the second MTP joint.  The extensor tendons were protected and a capsulotomy was made exposing the second metatarsal head.  The oscillating saw was used to cut a Weil osteotomy removing a small wedge of bone distally.  Head of the metatarsal was allowed to retract proximally a few millimeters and was fixed with a 2 mm Biomet FRS screw.  Overhanging bone was trimmed with a rongeur.  AP and lateral radiographs confirmed appropriate shortening of the second metatarsal and appropriate position of the screw.  The wound was irrigated copiously.  The simultaneous tissues were approximated with Monocryl.  The skin incision was closed with nylon.  Tourniquet was released after application of dressings at 25 minutes.  Attention was then turned to the left lower extremity.  The same technique was used to resect the Morton's neuroma.  Again severe thickening of the nerve distal to the ligament was identified.  A Weil osteotomy was made in the second metatarsal again fixing it with a 2 mm screw.  Overhanging bone was again trimmed with a rongeur.  AP and lateral radiographs of the left foot showed appropriate shortening of the second metatarsal  and appropriate position of the screw.  The wound was irrigated and closed with Monocryl and nylon as described previously.  Sterile dressings were applied followed by a compression wrap.  Tourniquet was released after application of the dressings at 17 minutes.  Patient was then awakened from anesthesia and transported to the recovery room in stable condition.  Both feet were anesthetized with half percent Marcaine with epinephrine for postoperative pain control at the operative sites.  FOLLOW UP PLAN: Weightbearing as tolerated on both feet in flat postop shoes.  Follow-up with me in the office in 2 weeks for suture removal.  We will plan to transition to regular shoes at that visit.   RADIOGRAPHS: AP and lateral radiographs of both feet are obtained intraoperatively.  Both feet show appropriate shortening of the second metatarsal with appropriate position of the screw.  No other acute injuries are noted.    Mechele Claude PA-C was present and scrubbed for the duration of the operative case. His assistance assistance was essential in positioning the patient, prepping and draping, gaining maintaining exposure, performing the operation, closing and dressing the wounds and applying the splint.

## 2017-01-29 NOTE — Transfer of Care (Signed)
Immediate Anesthesia Transfer of Care Note  Patient: Tanya Kelley  Procedure(s) Performed: Morton's Neuroma Excision Second Web Space (Bilateral Foot) Bilateral Second Metatarsal Weil Osteotomy (Bilateral Foot)  Patient Location: PACU  Anesthesia Type:General  Level of Consciousness: awake, alert , oriented and patient cooperative  Airway & Oxygen Therapy: Patient Spontanous Breathing and Patient connected to face mask oxygen  Post-op Assessment: Report given to RN and Post -op Vital signs reviewed and stable  Post vital signs: Reviewed and stable  Last Vitals:  Vitals:   01/29/17 0900  BP: (!) 105/56  Pulse: 63  Resp: 16  Temp: 36.8 C  SpO2: 100%    Last Pain:  Vitals:   01/29/17 0900  TempSrc: Oral         Complications: No apparent anesthesia complications

## 2017-01-29 NOTE — Anesthesia Preprocedure Evaluation (Signed)
Anesthesia Evaluation  Patient identified by MRN, date of birth, ID band Patient awake    Reviewed: Allergy & Precautions, NPO status , Patient's Chart, lab work & pertinent test results  History of Anesthesia Complications (+) PONV and history of anesthetic complications  Airway Mallampati: I  TM Distance: >3 FB Neck ROM: Full    Dental  (+) Teeth Intact, Dental Advisory Given   Pulmonary former smoker,    Pulmonary exam normal breath sounds clear to auscultation       Cardiovascular Exercise Tolerance: Good negative cardio ROS Normal cardiovascular exam Rhythm:Regular Rate:Normal     Neuro/Psych  Neuromuscular disease negative psych ROS   GI/Hepatic Neg liver ROS, GERD  ,Barrett's esophagus   Endo/Other  negative endocrine ROS  Renal/GU negative Renal ROS     Musculoskeletal  (+) Arthritis , Osteoarthritis,    Abdominal   Peds  Hematology negative hematology ROS (+)   Anesthesia Other Findings Day of surgery medications reviewed with the patient.  Reproductive/Obstetrics                             Anesthesia Physical Anesthesia Plan  ASA: II  Anesthesia Plan: General   Post-op Pain Management:    Induction: Intravenous  PONV Risk Score and Plan: 4 or greater and Scopolamine patch - Pre-op, Midazolam, Dexamethasone and Ondansetron  Airway Management Planned: Oral ETT  Additional Equipment:   Intra-op Plan:   Post-operative Plan: Extubation in OR  Informed Consent: I have reviewed the patients History and Physical, chart, labs and discussed the procedure including the risks, benefits and alternatives for the proposed anesthesia with the patient or authorized representative who has indicated his/her understanding and acceptance.   Dental advisory given  Plan Discussed with: CRNA  Anesthesia Plan Comments: (Risks/benefits of general anesthesia discussed with patient  including risk of damage to teeth, lips, gum, and tongue, nausea/vomiting, allergic reactions to medications, and the possibility of heart attack, stroke and death.  All patient questions answered.  Patient wishes to proceed.)        Anesthesia Quick Evaluation

## 2017-01-29 NOTE — H&P (Signed)
Tanya Kelley is an 46 y.o. female.   Chief Complaint: bilat foot pain HPI: 46 y/o female with chronic bilat forefoot pain from Morton's neuroma and metatarsalgia.  She has failed nonop treatment and presents today for excision of 2nd webspace Morton's neuroma and 2nd MT weil osteotomy bilaterally.  Past Medical History:  Diagnosis Date  . ABDOMINAL BLOATING 04/10/2009   Qualifier: Diagnosis of  By: Nelson-Smith CMA (AAMA), Dottie    . Abdominal pain, unspecified site 04/10/2009   Qualifier: Diagnosis of  By: Nelson-Smith CMA (AAMA), Dottie    . ANEMIA, IRON DEFICIENCY 04/12/2009   Qualifier: Diagnosis of  By: Surface RN, Butch Penny    . B12 DEFICIENCY 04/12/2009   Qualifier: Diagnosis of  By: Surface RN, Butch Penny    . Barrett esophagus 2013  . CERVICAL RADICULOPATHY, RIGHT 06/26/2009   Qualifier: Diagnosis of  By: Lorelei Pont MD, Frederico Hamman    . Chest pain   . Colon polyps   . CONSTIPATION 04/10/2009   Qualifier: Diagnosis of  By: Nelson-Smith CMA (AAMA), Dottie    . COPD 04/10/2009   no per pt 05-25-15  . Diarrhea 04/10/2009   Qualifier: Diagnosis of  By: Nelson-Smith CMA (AAMA), Dottie    . Fatigue   . GERD (gastroesophageal reflux disease)   . Hot flashes   . Hyperlipidemia   . Jaw pain   . MELENA 04/10/2009   Qualifier: Diagnosis of  By: Nelson-Smith CMA (AAMA), Dottie    . Nausea   . PONV (postoperative nausea and vomiting)   . SACROILIITIS, RIGHT 06/26/2009   Qualifier: Diagnosis of  By: Lorelei Pont MD, Frederico Hamman    . Shoulder pain   . WEIGHT LOSS 04/10/2009   Qualifier: Diagnosis of  By: Harlon Ditty CMA (AAMA), Dottie      Past Surgical History:  Procedure Laterality Date  . CHOLECYSTECTOMY    . COLONOSCOPY    . FOOT SURGERY Bilateral   . TUBAL LIGATION    . UPPER GASTROINTESTINAL ENDOSCOPY  06/12/2015    Family History  Problem Relation Age of Onset  . Hypertension Father   . Clotting disorder Father   . Colon polyps Father   . Healthy Mother   . Healthy Brother   . Heart attack  Paternal Grandfather   . Heart disease Paternal Grandfather   . Early death Paternal Grandfather 96       MI  . Colon cancer Paternal Grandmother 64       alive  . Clotting disorder Paternal Grandmother   . Colon polyps Paternal Aunt        x 2  . Esophageal cancer Neg Hx   . Rectal cancer Neg Hx   . Stomach cancer Neg Hx    Social History:  reports that she has quit smoking. Her smoking use included cigarettes. She smoked 0.00 packs per day for 27.00 years. she has never used smokeless tobacco. She reports that she drinks alcohol. She reports that she does not use drugs.  Allergies: No Known Allergies  Medications Prior to Admission  Medication Sig Dispense Refill  . ibuprofen (ADVIL,MOTRIN) 600 MG tablet Take 1 tablet (600 mg total) by mouth every 6 (six) hours as needed. 30 tablet 0  . valACYclovir (VALTREX) 1000 MG tablet Take 2 tabs with onset of symptoms and then 2 tabs in 12 hours PRN for cold sore 20 tablet 3    No results found for this or any previous visit (from the past 48 hour(s)). No results found.  ROS  No recent f/c/n/v/wt loss  Blood pressure (!) 105/56, pulse 63, temperature 98.3 F (36.8 C), temperature source Oral, resp. rate 16, height 5\' 8"  (1.727 m), weight 71.7 kg (158 lb), last menstrual period 05/18/2015, SpO2 100 %. Physical Exam  wn wd female in nad.  A and O x 4.  Mood and affect normal.  EOMI.  resp unlabored.  Bilat feet TTP at 2nd MT head plantarly.  Skin healthy and intact.  No lymphadenopathy.  5/5 strength in PF and DF of the toes.  Sens to LT diminished at the 2nd webspace bilat.  Pulses are palpable bilaterally.  Assessment/Plan Bilat metatarsalgia and 2nd webspace Morton's neuroma - to OR for surgical treatment.  The risks and benefits of the alternative treatment options have been discussed in detail.  The patient wishes to proceed with surgery and specifically understands risks of bleeding, infection, nerve damage, blood clots, need for  additional surgery, amputation and death.   Wylene Simmer, MD 05-Feb-2017, 9:59 AM

## 2017-01-29 NOTE — Anesthesia Postprocedure Evaluation (Signed)
Anesthesia Post Note  Patient: Tanya Kelley  Procedure(s) Performed: Morton's Neuroma Excision Second Web Space (Bilateral Foot) Bilateral Second Metatarsal Weil Osteotomy (Bilateral Foot)     Patient location during evaluation: PACU Anesthesia Type: General Level of consciousness: awake and alert Pain management: pain level controlled Vital Signs Assessment: post-procedure vital signs reviewed and stable Respiratory status: spontaneous breathing, nonlabored ventilation and respiratory function stable Cardiovascular status: blood pressure returned to baseline and stable Postop Assessment: no apparent nausea or vomiting Anesthetic complications: no    Last Vitals:  Vitals:   01/29/17 1215 01/29/17 1236  BP: 108/84 117/76  Pulse: 76 90  Resp: 16 16  Temp:  36.6 C  SpO2: 99% 100%    Last Pain:  Vitals:   01/29/17 1236  TempSrc: Oral  PainSc: 3                  Catalina Gravel

## 2017-01-29 NOTE — Anesthesia Procedure Notes (Signed)
Procedure Name: Intubation Date/Time: 01/29/2017 10:10 AM Performed by: Signe Colt, CRNA Pre-anesthesia Checklist: Patient identified, Emergency Drugs available, Suction available and Patient being monitored Patient Re-evaluated:Patient Re-evaluated prior to induction Oxygen Delivery Method: Circle system utilized Preoxygenation: Pre-oxygenation with 100% oxygen Induction Type: IV induction Ventilation: Mask ventilation without difficulty Laryngoscope Size: Mac and 3 Grade View: Grade I Tube type: Oral Number of attempts: 1 Airway Equipment and Method: Stylet and Oral airway Placement Confirmation: ETT inserted through vocal cords under direct vision,  positive ETCO2 and breath sounds checked- equal and bilateral Secured at: 21 cm Tube secured with: Tape Dental Injury: Teeth and Oropharynx as per pre-operative assessment

## 2017-01-30 ENCOUNTER — Encounter (HOSPITAL_BASED_OUTPATIENT_CLINIC_OR_DEPARTMENT_OTHER): Payer: Self-pay | Admitting: Orthopedic Surgery

## 2017-04-21 ENCOUNTER — Ambulatory Visit: Payer: BLUE CROSS/BLUE SHIELD | Admitting: Family Medicine

## 2017-05-08 ENCOUNTER — Emergency Department (HOSPITAL_COMMUNITY)
Admission: EM | Admit: 2017-05-08 | Discharge: 2017-05-09 | Disposition: A | Discharge status: Other Acute Inpatient Hospital | Payer: BLUE CROSS/BLUE SHIELD | Attending: Emergency Medicine | Admitting: Emergency Medicine

## 2017-05-08 ENCOUNTER — Ambulatory Visit (HOSPITAL_COMMUNITY)
Admission: RE | Admit: 2017-05-08 | Discharge: 2017-05-08 | Disposition: A | Discharge status: Home / Self Care | Payer: BLUE CROSS/BLUE SHIELD | Source: Home / Self Care | Attending: Psychiatry | Admitting: Psychiatry

## 2017-05-08 ENCOUNTER — Encounter (HOSPITAL_COMMUNITY): Payer: Self-pay | Admitting: Emergency Medicine

## 2017-05-08 ENCOUNTER — Emergency Department (HOSPITAL_COMMUNITY): Payer: BLUE CROSS/BLUE SHIELD

## 2017-05-08 DIAGNOSIS — F322 Major depressive disorder, single episode, severe without psychotic features: Secondary | ICD-10-CM

## 2017-05-08 DIAGNOSIS — F22 Delusional disorders: Secondary | ICD-10-CM | POA: Diagnosis present

## 2017-05-08 DIAGNOSIS — F17210 Nicotine dependence, cigarettes, uncomplicated: Secondary | ICD-10-CM | POA: Insufficient documentation

## 2017-05-08 DIAGNOSIS — R442 Other hallucinations: Secondary | ICD-10-CM | POA: Diagnosis present

## 2017-05-08 DIAGNOSIS — J449 Chronic obstructive pulmonary disease, unspecified: Secondary | ICD-10-CM | POA: Diagnosis not present

## 2017-05-08 DIAGNOSIS — F4321 Adjustment disorder with depressed mood: Secondary | ICD-10-CM | POA: Diagnosis present

## 2017-05-08 LAB — COMPREHENSIVE METABOLIC PANEL
ALT: 19 U/L (ref 14–54)
ANION GAP: 9 (ref 5–15)
AST: 22 U/L (ref 15–41)
Albumin: 4.5 g/dL (ref 3.5–5.0)
Alkaline Phosphatase: 66 U/L (ref 38–126)
BUN: 10 mg/dL (ref 6–20)
CHLORIDE: 107 mmol/L (ref 101–111)
CO2: 26 mmol/L (ref 22–32)
Calcium: 9.5 mg/dL (ref 8.9–10.3)
Creatinine, Ser: 0.83 mg/dL (ref 0.44–1.00)
GFR calc Af Amer: >60 mL/min (ref >60)
Glucose, Bld: 98 mg/dL (ref 65–99)
POTASSIUM: 3.5 mmol/L (ref 3.5–5.1)
Sodium: 142 mmol/L (ref 135–145)
Total Bilirubin: 0.5 mg/dL (ref 0.3–1.2)
Total Protein: 7.4 g/dL (ref 6.5–8.1)

## 2017-05-08 LAB — RAPID URINE DRUG SCREEN, HOSP PERFORMED
Amphetamines: NOT DETECTED
Barbiturates: NOT DETECTED
Benzodiazepines: NOT DETECTED
COCAINE: NOT DETECTED
OPIATES: NOT DETECTED
TETRAHYDROCANNABINOL: NOT DETECTED

## 2017-05-08 LAB — CBC
HCT: 41.9 % (ref 36.0–46.0)
Hemoglobin: 14.3 g/dL (ref 12.0–15.0)
MCH: 30.8 pg (ref 26.0–34.0)
MCHC: 34.1 g/dL (ref 30.0–36.0)
MCV: 90.3 fL (ref 78.0–100.0)
Platelets: 189 10*3/uL (ref 150–400)
RBC: 4.64 MIL/uL (ref 3.87–5.11)
RDW: 12.8 % (ref 11.5–15.5)
WBC: 7.7 10*3/uL (ref 4.0–10.5)

## 2017-05-08 LAB — I-STAT BETA HCG BLOOD, ED (MC, WL, AP ONLY)

## 2017-05-08 LAB — ETHANOL

## 2017-05-08 MED ORDER — LORAZEPAM 1 MG PO TABS: 1.0000 mg | ORAL_TABLET | Freq: Four times a day (QID) | ORAL | Status: DC | PRN

## 2017-05-08 MED ORDER — ACETAMINOPHEN 325 MG PO TABS
650.0000 mg | ORAL_TABLET | ORAL | Status: DC | PRN
Start: 2017-05-08 — End: 2017-05-09

## 2017-05-08 MED ORDER — ONDANSETRON HCL 4 MG PO TABS: 4.0000 mg | ORAL_TABLET | Freq: Three times a day (TID) | ORAL | Status: DC | PRN

## 2017-05-08 MED ORDER — ZOLPIDEM TARTRATE 5 MG PO TABS
5.0000 mg | ORAL_TABLET | Freq: Every evening | ORAL | Status: DC | PRN
  Administered 2017-05-08: 5 mg via ORAL
  Filled 2017-05-08: qty 1

## 2017-05-08 MED ORDER — ACETAMINOPHEN 325 MG PO TABS
650.0000 mg | ORAL_TABLET | Freq: Once | ORAL | Status: AC
  Administered 2017-05-08: 650 mg via ORAL
  Filled 2017-05-08: qty 2

## 2017-05-08 MED ORDER — ALUM & MAG HYDROXIDE-SIMETH 200-200-20 MG/5ML PO SUSP: 30.0000 mL | Freq: Four times a day (QID) | ORAL | Status: DC | PRN

## 2017-05-08 NOTE — ED Notes (Signed)
Patient given meal tray.

## 2017-05-08 NOTE — BH Assessment (Signed)
Assessment Note  Tanya Kelley is an 47 y.o. female voluntarily presents to [facility] with her mother. Pt reports she is delusional and emotionless. Pt reports her "bizare thoughts and behaviors" started two months ago. Pt reports she is having thoughts that she had an affair with her brother-in-law that her family states did not happen. Pt reports she had her husband pulling the flooring stating she had hidden money that was not there. Pt reports restless motor behavior and erratic eye movement. Pt states she has had a bad headache and tingling in her forehead.  Pt reports "I feel like I have evil in me and that something horrible happened to me when I was young". She has had episodes of being angry for no reason with no real aggression. Pt reports she cannot focus or think clearly. Pt lives with her husband and 11 year old son. Pt completed her GED and some college.   Pt is dressed in street clothes, alert, oriented x4 with normal speech and restless motor behavior. Eye contact is good and Pt is anxious. Pt's mood is depressed and affect is anxious. Thought process is coherent and relevant during assessment. Pt's insight is poor and judgement is impaired. There is no indication Pt is currently responding to internal stimuli or experiencing delusional thought content. Pt was cooperative throughout assessment. She says he is willing to sign voluntarily into a psychiatric facility.   Diagnosis: F32.2 Major depressive disorder, Single episode, Severe  Past Medical History:  Past Medical History:  Diagnosis Date  . ABDOMINAL BLOATING 04/10/2009   Qualifier: Diagnosis of  By: Nelson-Smith CMA (AAMA), Dottie    . Abdominal pain, unspecified site 04/10/2009   Qualifier: Diagnosis of  By: Nelson-Smith CMA (AAMA), Dottie    . ANEMIA, IRON DEFICIENCY 04/12/2009   Qualifier: Diagnosis of  By: Surface RN, Butch Penny    . B12 DEFICIENCY 04/12/2009   Qualifier: Diagnosis of  By: Surface RN, Butch Penny    . Barrett esophagus  2013  . CERVICAL RADICULOPATHY, RIGHT 06/26/2009   Qualifier: Diagnosis of  By: Lorelei Pont MD, Frederico Hamman    . Chest pain   . Colon polyps   . CONSTIPATION 04/10/2009   Qualifier: Diagnosis of  By: Nelson-Smith CMA (AAMA), Dottie    . COPD 04/10/2009   no per pt 05-25-15  . Diarrhea 04/10/2009   Qualifier: Diagnosis of  By: Nelson-Smith CMA (AAMA), Dottie    . Fatigue   . GERD (gastroesophageal reflux disease)   . Hot flashes   . Hyperlipidemia   . Jaw pain   . MELENA 04/10/2009   Qualifier: Diagnosis of  By: Nelson-Smith CMA (AAMA), Dottie    . Nausea   . PONV (postoperative nausea and vomiting)   . SACROILIITIS, RIGHT 06/26/2009   Qualifier: Diagnosis of  By: Lorelei Pont MD, Frederico Hamman    . Shoulder pain   . WEIGHT LOSS 04/10/2009   Qualifier: Diagnosis of  By: Harlon Ditty CMA (AAMA), Dottie      Past Surgical History:  Procedure Laterality Date  . CHOLECYSTECTOMY    . COLONOSCOPY    . 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 FOOT SURGERY Bilateral   . TUBAL LIGATION    . UPPER GASTROINTESTINAL ENDOSCOPY  06/12/2015  . WEIL OSTEOTOMY Bilateral 01/29/2017   Procedure: Bilateral Second Metatarsal Weil Osteotomy;  Surgeon: Wylene Simmer, MD;  Location:  [place];  Service: Orthopedics;  Laterality: Bilateral;    Family History:  Family History  Problem Relation Age of Onset  . Hypertension Father   . Clotting disorder Father   . Colon polyps Father   . Healthy Mother   . Healthy Brother   . Heart attack Paternal Grandfather   . Heart disease Paternal Grandfather   . Early death Paternal Grandfather 17       MI  . Colon cancer Paternal Grandmother 54       alive  . Clotting disorder Paternal Grandmother   . Colon polyps Paternal Aunt        x 2  . Esophageal cancer Neg Hx   . Rectal cancer Neg  Hx   . Stomach cancer Neg Hx     Social History:  reports that she has quit smoking. Her smoking use included cigarettes. She smoked 0.00 packs per day for 27.00 years. she has never used smokeless tobacco. She reports that she drinks alcohol. She reports that she does not use drugs.  Additional Social History:  Alcohol / Drug Use Pain Medications: See MAR Prescriptions: See MAR Over the Counter: See MAR History of alcohol / drug use?: No history of alcohol / drug abuse  CIWA: CIWA-Ar BP: 121/82 Pulse Rate: (!) 114 COWS:    Allergies: No Known Allergies  Home Medications:  (Not in a hospital admission)  OB/GYN Status:  Patient's last menstrual period was 05/18/2015.  General Assessment Data Location of Assessment: [facility] Assessment Services TTS Assessment: In system Is this a Tele or Face-to-Face Assessment?: Face-to-Face Is this an Initial Assessment or a Re-assessment for this encounter?: Initial Assessment Marital status: Married Is patient pregnant?: No Pregnancy Status: No Living Arrangements: Spouse/significant other Can pt return to current living arrangement?: Yes Admission Status: Voluntary Is patient capable of signing voluntary admission?: Yes Referral Source: Self/Family/Friend Insurance type: Insurance risk surveyor Exam ([place]) Medical Exam completed: Yes  Crisis Care Plan Living Arrangements: Spouse/significant other Name of Psychiatrist: None Name of Therapist: Scientist, forensic  Education Status Is patient currently in school?: No Is the patient employed, unemployed or receiving disability?: Employed  Risk to self with the past 6 months Suicidal Ideation: Yes-Currently Present Has patient been a risk to self within the past 6 months prior to admission? : No Suicidal Intent: No Has patient had any suicidal intent within the past 6 months prior to admission? : No Is patient at risk for suicide?: Yes Suicidal Plan?: No Has patient had any  suicidal plan within the past 6 months prior to admission? : No Access to Means: No What has been your use of drugs/alcohol within the last 12 months?: None Previous Attempts/Gestures: No Intentional Self Injurious Behavior: None Family Suicide History: No Recent stressful life event(s): Other (Comment), Conflict (Comment)(Pt reports minimal conflict with husband no real stressors) Persecutory voices/beliefs?: No Depression: Yes Depression Symptoms: (Pt reports she feels she had no emotion) Substance abuse history and/or treatment for substance abuse?: No Suicide prevention information given to non-admitted patients: Not applicable  Risk to Others within the past 6 months Homicidal Ideation: No Does patient have any lifetime risk of violence toward others beyond the six months prior to admission? : No Thoughts of Harm to Others: No Current Homicidal Intent: No Current Homicidal Plan: No Access to Homicidal Means: No History of harm to others?: No Assessment of Violence: On admission Does patient have access to weapons?: No Criminal Charges Pending?: No Does  patient have a court date: No Is patient on probation?: No  Psychosis Hallucinations: None noted Delusions: Unspecified(Bizarre Delusions)  Mental Status Report Appearance/Hygiene: Unremarkable Eye Contact: Good Motor Activity: Freedom of movement Speech: Logical/coherent Level of Consciousness: Alert Mood: Empty, Anxious, Depressed Affect: Appropriate to circumstance Anxiety Level: Minimal Thought Processes: Coherent, Relevant Judgement: Partial Orientation: Person, Place, Time, Situation, Appropriate for developmental age Obsessive Compulsive Thoughts/Behaviors: None  Cognitive Functioning Concentration: Normal Memory: Recent Intact Is patient IDD: No Is patient DD?: No Insight: Poor Impulse Control: Fair Appetite: Poor Have you had any weight changes? : Loss Amount of the weight change? (lbs): 45  lbs Sleep: No Change Total Hours of Sleep: 8 Vegetative Symptoms: None  ADLScreening [facility] Assessment Services) Patient's cognitive ability adequate to safely complete daily activities?: Yes Patient able to express need for assistance with ADLs?: Yes Independently performs ADLs?: Yes (appropriate for developmental age)  Prior Inpatient Therapy Prior Inpatient Therapy: No  Prior Outpatient Therapy Prior Outpatient Therapy: No Does patient have an ACCT team?: No Does patient have Intensive In-House Services?  : No Does patient have [facility] services? : No Does patient have P4CC services?: No  ADL Screening (condition at time of admission) Patient's cognitive ability adequate to safely complete daily activities?: Yes Is the patient deaf or have difficulty hearing?: No Does the patient have difficulty seeing, even when wearing glasses/contacts?: No Does the patient have difficulty concentrating, remembering, or making decisions?: No Patient able to express need for assistance with ADLs?: Yes Does the patient have difficulty dressing or bathing?: No Independently performs ADLs?: Yes (appropriate for developmental age) Does the patient have difficulty walking or climbing stairs?: No Weakness of Legs: None Weakness of Arms/Hands: None     Therapy Consults (therapy consults require a physician order) PT Evaluation Needed: No OT Evalulation Needed: No SLP Evaluation Needed: No Abuse/Neglect Assessment (Assessment to be complete while patient is alone) Abuse/Neglect Assessment Can Be Completed: Yes Physical Abuse: Denies Verbal Abuse: Denies Sexual Abuse: Denies Exploitation of patient/patient's resources: Denies Self-Neglect: Denies Values / Beliefs Cultural Requests During Hospitalization: None Spiritual Requests During Hospitalization: None Consults Spiritual Care Consult Needed: No Social Work Consult Needed: No Regulatory affairs officer (For Healthcare) Does Patient Have a  Medical Advance Directive?: No Would patient like information on creating a medical advance directive?: No - Patient declined    Additional Information 1:1 In Past 12 Months?: No CIRT Risk: No Elopement Risk: No Does patient have medical clearance?: No     Disposition:  Disposition Initial Assessment Completed for this Encounter: Yes Disposition of Patient: Movement to [facility] or [facility] ED(Pt sent to [facility] for medical clearance)  On Site Evaluation by:   Reviewed with Physician:    Per Earleen Newport, NP pt needs to be medically cleared. Pt sent to [facility] for medical clearance.   [place], [place], LPCA 05/08/2017 4:18 PM

## 2017-05-08 NOTE — BHH Counselor (Signed)
Per Tanya Sheffield, RN pt wanted to leave [facility]. Tanya Kelley, [place] in to talked to pt about her presenting behaviors and possible treatment options.   Clinician spoke to Tanya Kelley, Tanya Kelley noted the pt is agreeable to stay overnight and be re-evaluated by psychiatry in the morning. Plan was discussed with Tanya Sheffield, RN.    Tanya Novak, MS, [facility], [facility] Triage Specialist [phone]

## 2017-05-08 NOTE — ED Notes (Signed)
Patient requesting tylenol for headache. PA made aware.

## 2017-05-08 NOTE — H&P (Signed)
Behavioral Health Medical Screening Exam  Tanya Kelley is an 47 y.o. female patient presents as walk in at [facility] with complaints of delusional thinking that she has had an affair with her brother in law and cause family discord; having her husband pull up the wood floor because she has buried money in the floor; and family states that patient started acting odd for several weeks ago.  Patient states that she has been having headaches; unable to focus, or concentrate.  States that her memory has been affected.  Patient denies prior history of psychiatric illness; but the states that she has been having flashbacks as a child and thinks she may have been inpatient at a psychiatric hospital before when she was a child but is unsure related to memory an other delusional thinking.  Family states that patient has rapid movement of eyes when she is talking to them and trying to concentrate.  Patient also states that she has lost 40 pounds without trying over the last 6 months.  Patient denies taking any medications.  Patient states that she has not been having suicidal thoughts but the way she feels; she doesn't care if she dies.  Patient denies self-harming/homicidal ideation.  States that she has been having some paranoia feeling like everyone is watching her      Total Time spent with patient: 45 minutes  Psychiatric Specialty Exam: Physical Exam  Vitals reviewed. Constitutional: She is oriented to person, place, and time. She appears well-developed.  Neck: Normal range of motion. Neck supple.  Respiratory: Effort normal.  Musculoskeletal: Normal range of motion.  Neurological: She is alert and oriented to person, place, and time.  Skin: Skin is warm and dry.    Review of Systems  Constitutional: Positive for weight loss (40 lbs last 6 months without trying to lose wt).  HENT: Negative.   Eyes: Negative for double vision, pain and discharge.       Rapid movement of eyes; feeling like a shadow  passing over eyes on and off  Respiratory: Negative.   Cardiovascular: Negative.   Gastrointestinal: Negative.   Genitourinary: Negative.   Musculoskeletal: Negative.   Neurological: Positive for headaches (Recently started last couple weeks.  At this time rateds 7/10 (0/none nd 10/worse)). Negative for seizures.  Psychiatric/Behavioral: Positive for memory loss. Depression: No history of depression; but some depression now related to her situation with the headaches and delusional thoughts. Hallucinations: Delusional thinking. Substance abuse: Denies. Suicidal ideas: Reports that she does not want to kill herself; but the way she feels she does not care if she dies. The patient is nervous/anxious.     Blood pressure 121/82, pulse (!) 114, temperature 98.2 F (36.8 C), temperature source Oral, resp. rate 18, last menstrual period 05/18/2015.There is no height or weight on file to calculate BMI.  General Appearance: Casual  Eye Contact:  Good  Speech:  Clear and Coherent and Normal Rate  Volume:  Normal  Mood:  Anxious and Depressed  Affect:  Appropriate and Congruent  Thought Process:  Coherent and Goal Directed  Orientation:  Full (Time, Place, and Person)  Thought Content:  Delusions and Paranoid Ideation  Suicidal Thoughts:  No suicidal thoughts; "The way I feel; I don't care if I die; but I don't want to kill myself  Homicidal Thoughts:  No  Memory:  Immediate;   Fair Recent;   Fair Remote;   Poor  Judgement:  Impaired  Insight:  Lacking  Psychomotor Activity:  Normal  Concentration: Concentration: Poor and Attention Span: Poor  Recall:  Poor  Fund of Knowledge:Fair  Language: Good  Akathisia:  No  Handed:  Right  AIMS (if indicated):     Assets:  Communication Skills Desire for Improvement Housing Resilience Social Support Transportation  Sleep:       Musculoskeletal: Strength & Muscle Tone: within normal limits Gait & Station: normal Patient leans: N/A  Blood  pressure 121/82, pulse (!) 114, temperature 98.2 F (36.8 C), temperature source Oral, resp. rate 18, last menstrual period 05/18/2015.  Recommendations:  Send patient to rule out medical illness related to no prior psychiatric history; when medically cleared if patient continues to have problems can have re-evaluation done by psychiatrist to determine if patient need inpatient psychiatric treatment.    Spoke with Dr. Venora Maples [facility] EDP) about above evaluations and that I was sending patient to ED for medical clearance and psychiatric evaluation if no medical issue found.    Based on my evaluation the patient appears to have an emergency medical condition for which I recommend the patient be transferred to the emergency department for further evaluation.  Megham Dwyer, NP 05/08/2017, 4:01 PM

## 2017-05-08 NOTE — ED Notes (Signed)
Pt requesting to leave.  Santiago Glad, PA-C informed.

## 2017-05-08 NOTE — ED Notes (Signed)
Bed: [facility] Expected date:  Expected time:  Means of arrival:  Comments: [place]

## 2017-05-08 NOTE — ED Notes (Signed)
Patient's mother at bedside.

## 2017-05-08 NOTE — ED Provider Notes (Signed)
[facility] DEPT Provider Note   CSN: [id number] Arrival date & time: 05/08/17  1617     History   Chief Complaint Chief Complaint  Patient presents with  . Medical Clearance  . Delusional  . Headache    HPI Tanya Kelley is a 47 y.o. female.  No language interpreter was used.  Mental Health Problem  Presenting symptoms: bizarre behavior, delusional, disorganized thought process, hallucinations and paranoid behavior   Patient accompanied by:  Family member Degree of incapacity (severity):  Severe Onset quality:  Sudden Duration:  2 weeks Timing:  Constant Progression:  Worsening Chronicity:  New Context: not alcohol use, not drug abuse, not medication, not noncompliant and not recent medication change   Relieved by:  Nothing Ineffective treatments:  None tried Associated symptoms: anxiety, irritability and poor judgment   Associated symptoms: no abdominal pain and no chest pain   Risk factors: no hx of mental illness    Pt reports that she listened to a hypnosis video and has had problems since.  Pt reports she has no emotion.  Pt reports she has bizarre thoughts. Pt thinks something bad happened to her.  Pt reports she thinks she had an affair with her brother in law.  Pt pulled up floor because she thoughts she had hidden money.  Pt reports she can not do math, can not write checks.  Past Medical History:  Diagnosis Date  . ABDOMINAL BLOATING 04/10/2009   Qualifier: Diagnosis of  By: Nelson-Smith CMA (AAMA), Dottie    . Abdominal pain, unspecified site 04/10/2009   Qualifier: Diagnosis of  By: Nelson-Smith CMA (AAMA), Dottie    . ANEMIA, IRON DEFICIENCY 04/12/2009   Qualifier: Diagnosis of  By: Surface RN, Butch Penny    . B12 DEFICIENCY 04/12/2009   Qualifier: Diagnosis of  By: Surface RN, Butch Penny    . Barrett esophagus 2013  . CERVICAL RADICULOPATHY, RIGHT 06/26/2009   Qualifier: Diagnosis of  By: Lorelei Pont MD, Frederico Hamman    . Chest pain   .  Colon polyps   . CONSTIPATION 04/10/2009   Qualifier: Diagnosis of  By: Nelson-Smith CMA (AAMA), Dottie    . COPD 04/10/2009   no per pt 05-25-15  . Diarrhea 04/10/2009   Qualifier: Diagnosis of  By: Nelson-Smith CMA (AAMA), Dottie    . Fatigue   . GERD (gastroesophageal reflux disease)   . Hot flashes   . Hyperlipidemia   . Jaw pain   . MELENA 04/10/2009   Qualifier: Diagnosis of  By: Nelson-Smith CMA (AAMA), Dottie    . Nausea   . PONV (postoperative nausea and vomiting)   . SACROILIITIS, RIGHT 06/26/2009   Qualifier: Diagnosis of  By: Lorelei Pont MD, Frederico Hamman    . Shoulder pain   . WEIGHT LOSS 04/10/2009   Qualifier: Diagnosis of  By: Harlon Ditty CMA (AAMA), Dottie      Patient Active Problem List   Diagnosis Date Noted  . Weight loss, unintentional 01/05/2017  . Breast mass 01/05/2017  . Right knee pain 11/19/2015  . Effusion of bursa of right knee 11/19/2015  . Lymphadenitis 05/18/2015  . 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 Hyperlipidemia LDL goal <100 04/09/2015  . Vitamin D deficiency 04/09/2015  . Tobacco use disorder 04/06/2015  . Encounter to establish care 04/06/2015  . Encounter for preventive health examination 04/06/2015  . Barrett esophagus   . Chest pain   . Hot flashes     Past Surgical History:  Procedure Laterality Date  .  CHOLECYSTECTOMY    . COLONOSCOPY    . 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 FOOT SURGERY Bilateral   . TUBAL LIGATION    . UPPER GASTROINTESTINAL ENDOSCOPY  06/12/2015  . WEIL OSTEOTOMY Bilateral 01/29/2017   Procedure: Bilateral Second Metatarsal Weil Osteotomy;  Surgeon: Wylene Simmer, MD;  Location: [facility];  Service: Orthopedics;  Laterality: Bilateral;    OB History    Gravida Para Term Preterm AB Living   3 3       3    SAB TAB Ectopic  Multiple Live Births                   Home Medications    Prior to Admission medications   Medication Sig Start Date End Date Taking? Authorizing Provider  ibuprofen (ADVIL,MOTRIN) 200 MG tablet Take 200 mg by mouth every 6 (six) hours as needed for headache.   Yes [provider]  docusate sodium (COLACE) 100 MG capsule Take 1 capsule (100 mg total) by mouth 2 (two) times daily. While taking narcotic pain medicine. Patient not taking: Reported on 05/08/2017 01/29/17   Corky Sing, PA-C  ibuprofen (ADVIL,MOTRIN) 600 MG tablet Take 1 tablet (600 mg total) by mouth every 6 (six) hours as needed. Patient not taking: Reported on 05/08/2017 12/05/16   Fransico Meadow, PA-C  oxyCODONE (ROXICODONE) 5 MG immediate release tablet Take 1 tablet (5 mg total) by mouth every 4 (four) hours as needed for moderate pain or severe pain. For no more than 4 days. Patient not taking: Reported on 05/08/2017 01/29/17   Corky Sing, PA-C  senna (SENOKOT) 8.6 MG TABS tablet Take 2 tablets (17.2 mg total) by mouth 2 (two) times daily. Patient not taking: Reported on 05/08/2017 01/29/17   Corky Sing, PA-C  valACYclovir (VALTREX) 1000 MG tablet Take 2 tabs with onset of symptoms and then 2 tabs in 12 hours PRN for cold sore Patient not taking: Reported on 05/08/2017 01/05/17   Ma Hillock, DO    Family History Family History  Problem Relation Age of Onset  . Hypertension Father   . Clotting disorder Father   . Colon polyps Father   . Healthy Mother   . Healthy Brother   . Heart attack Paternal Grandfather   . Heart disease Paternal Grandfather   . Early death Paternal Grandfather 5       MI  . Colon cancer Paternal Grandmother 60       alive  . Clotting disorder Paternal Grandmother   . Colon polyps Paternal Aunt        x 2  . Esophageal cancer Neg Hx   . Rectal cancer Neg Hx   . Stomach cancer Neg Hx     Social History Social History   Tobacco Use  . Smoking  status: Current Some Day Smoker    Packs/day: 0.00    Years: 27.00    Pack years: 0.00    Types: Cigarettes  . Smokeless tobacco: Never Used  . Tobacco comment: only smokes occasionally, with drinks  Substance Use Topics  . Alcohol use: Yes    Comment: occassional   . Drug use: No     Allergies   Patient has no known allergies.   Review of Systems Review of Systems  Constitutional: Positive for irritability.  Cardiovascular: Negative for chest pain.  Gastrointestinal: Negative for abdominal pain.  Psychiatric/Behavioral: Positive for hallucinations and paranoia. The patient is nervous/anxious.   All other systems reviewed and are negative.    Physical Exam Updated Vital Signs BP 97/63 (BP Location: Left Arm)   Pulse 92   Temp 98.3 F (36.8 C) (Oral)   Resp 17   LMP 05/18/2015   SpO2 99%   Physical Exam  Constitutional: She appears well-developed and well-nourished. No distress.  HENT:  Head: Normocephalic and atraumatic.  Eyes: Conjunctivae and EOM are normal. Pupils are equal, round, and reactive to light.  Neck: Neck supple.  Cardiovascular: Normal rate and regular rhythm.  No murmur heard. Pulmonary/Chest: Effort normal and breath sounds normal. No respiratory distress.  Abdominal: Soft. There is no tenderness.  Musculoskeletal: She exhibits no edema.  Neurological: She is alert. She has normal strength. No cranial nerve deficit.  Skin: Skin is warm and dry.  Psychiatric: She has a normal mood and affect.  Nursing note and vitals reviewed.    ED Treatments / Results  Labs (all labs ordered are listed, but only abnormal results are displayed) Labs Reviewed  CBC  COMPREHENSIVE METABOLIC PANEL  ETHANOL  RAPID URINE DRUG SCREEN, HOSP PERFORMED  I-STAT BETA HCG BLOOD, ED ([facility], [facility], [facility] ONLY)    EKG  EKG Interpretation None       Radiology Ct Head Wo Contrast  Result Date: 05/08/2017 CLINICAL DATA:  Altered mental status EXAM: CT HEAD WITHOUT  CONTRAST TECHNIQUE: Contiguous axial images were obtained from the base of the skull through the vertex without intravenous contrast. COMPARISON:  07/30/2005 FINDINGS: Brain: No evidence of acute infarction, hemorrhage, hydrocephalus, extra-axial collection or mass lesion/mass effect. Vascular: No hyperdense vessel or unexpected calcification. Skull: Normal. Negative for fracture or focal lesion. Sinuses/Orbits: No acute finding. Other: None. IMPRESSION: No acute intracranial abnormality noted. Electronically Signed   By: Inez Catalina M.D.   On: 05/08/2017 16:58    Procedures Procedures (including critical care time)  Medications Ordered in ED Medications - No data to display   Initial Impression / Assessment and Plan / ED Course  I have reviewed the triage vital signs and the nursing notes.  Pertinent labs & imaging results that were available during my care of the patient were reviewed by me and considered in my medical decision making (see chart for details).     MDM Ct scan and labs are normal.  Results discussed with pt and her Mother.  TTS has offered to have pt stay until tomorrow to see Psychiatrist.  Pt is not suicidal she is not homicidal.  Pt is staying voluntarily.  Pt understands she is free to leave.   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

## 2017-05-08 NOTE — ED Notes (Signed)
ED Provider at bedside. 

## 2017-05-08 NOTE — ED Triage Notes (Signed)
Pt brought over by [facility] from [facility] for medical clearance. Pt reports having delusion thoughts, headaches, weight loss of about 40 pounds in 4-6 months. No appetite. Denies SI or HI.

## 2017-05-09 DIAGNOSIS — F22 Delusional disorders: Secondary | ICD-10-CM | POA: Diagnosis present

## 2017-05-09 DIAGNOSIS — F4321 Adjustment disorder with depressed mood: Secondary | ICD-10-CM | POA: Diagnosis present

## 2017-05-09 LAB — URINALYSIS, ROUTINE W REFLEX MICROSCOPIC
Bacteria, UA: NONE SEEN
Bilirubin Urine: NEGATIVE
GLUCOSE, UA: NEGATIVE mg/dL
KETONES UR: NEGATIVE mg/dL
NITRITE: NEGATIVE
PH: 7 (ref 5.0–8.0)
Protein, ur: NEGATIVE mg/dL
SPECIFIC GRAVITY, URINE: 1.013 (ref 1.005–1.030)

## 2017-05-09 NOTE — [facility] Suicide Risk Assessment (Signed)
Suicide Risk Assessment  Discharge Assessment   [facility] Discharge Suicide Risk Assessment   Principal Problem: <principal problem not specified> Discharge Diagnoses:  Patient Active Problem List   Diagnosis Date Noted  . Weight loss, unintentional [R63.4] 01/05/2017  . Breast mass [N63.0] 01/05/2017  . Right knee pain [M25.561] 11/19/2015  . Effusion of bursa of right knee [M25.461] 11/19/2015  . Lymphadenitis [I88.9] 05/18/2015  . 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 Hyperlipidemia LDL goal <100 [E78.5] 04/09/2015  . Vitamin D deficiency [E55.9] 04/09/2015  . Tobacco use disorder [F17.200] 04/06/2015  . Encounter to establish care [Z76.89] 04/06/2015  . Encounter for preventive health examination [Z00.00] 04/06/2015  . 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 Chest pain [R07.9]   . Hot flashes [R23.2]     Total Time spent with patient: 45 minutes  Musculoskeletal: Strength & Muscle Tone: within normal limits Gait & Station: normal Patient leans: N/A   Psychiatric Specialty Exam: Physical Exam  Constitutional: She is oriented to person, place, and time. She appears well-developed and well-nourished.  HENT:  Head: Normocephalic.  Respiratory: Effort normal.  Musculoskeletal: Normal range of motion.  Neurological: She is alert and oriented to person, place, and time.  Psychiatric: Her speech is normal and behavior is normal. Thought content normal. Her mood appears anxious. Cognition and memory are normal. She expresses impulsivity. She exhibits a depressed mood.   Review of Systems  Psychiatric/Behavioral: Positive for depression. Negative for hallucinations, memory loss, substance abuse and suicidal ideas. The patient is nervous/anxious. The patient does not have insomnia.   All other systems reviewed and are negative.  Blood pressure (!) 99/50, pulse 77, temperature 98.5 F (36.9 C), temperature source Oral, resp. rate 16, last menstrual period 05/18/2015, SpO2 99 %.There is  no height or weight on file to calculate BMI. General Appearance: Casual Eye Contact:  Good Speech:  Clear and Coherent and Normal Rate Volume:  Normal Mood:  Anxious and Depressed Affect:  Appropriate and Depressed Thought Process:  Coherent, Goal Directed and Linear Orientation:  Full (Time, Place, and Person) Thought Content:  Logical Suicidal Thoughts:  No Homicidal Thoughts:  No Memory:  Immediate;   Good Recent;   Good Remote;   Fair Judgement:  Fair Insight:  Fair Psychomotor Activity:  Normal Concentration:  Concentration: Good and Attention Span: Good Recall:  Good Fund of Knowledge:  Good Language:  Good Akathisia:  No Handed:  Right AIMS (if indicated):    Assets:  Communication Skills Desire for Improvement Financial Resources/Insurance Housing Intimacy Physical Health Social Support Transportation Vocational/Educational ADL's:  Intact Cognition:  WNL   Mental Status Per Nursing Assessment::   On Admission:   Delusional thinking  Demographic Factors:  Caucasian  Loss Factors: NA  Historical Factors: Impulsivity  Risk Reduction Factors:   Responsible for children under 10 years of age, Sense of responsibility to family, Employed and Living with another person, especially a relative  Continued Clinical Symptoms:  Depression:   Impulsivity  Cognitive Features That Contribute To Risk:  Closed-mindedness    Suicide Risk:  Minimal: No identifiable suicidal ideation.  Patients presenting with no risk factors but with morbid ruminations; may be classified as minimal risk based on the severity of the depressive symptoms    Plan Of Care/Follow-up recommendations:  Activity:  as tolerated Diet:  Heart Healthy  Ethelene Hal, NP 05/09/2017, 12:24 PM

## 2017-05-09 NOTE — ED Notes (Signed)
Husband at bedside.  

## 2017-05-09 NOTE — ED Notes (Signed)
Pt d/c home with husband per MD order. Discharge summary reviewed with pt. Pt verbalizes understanding. Pt denies SI/HI/AVH. Pt signed for personal property and property returned. Pt signed e-signature. Ambulatory off unit.

## 2017-05-09 NOTE — Discharge Instructions (Signed)
Follow up at   [facility] [address] [place], [place] [address] Tel [phone]
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
## 2017-12-26 ENCOUNTER — Emergency Department (HOSPITAL_COMMUNITY)
Admission: EM | Admit: 2017-12-26 | Discharge: 2017-12-27 | Disposition: A | Discharge status: Other Acute Inpatient Hospital | Payer: BLUE CROSS/BLUE SHIELD | Source: Home / Self Care | Attending: Emergency Medicine | Admitting: Emergency Medicine

## 2017-12-26 ENCOUNTER — Other Ambulatory Visit: Payer: Self-pay

## 2017-12-26 DIAGNOSIS — F17210 Nicotine dependence, cigarettes, uncomplicated: Secondary | ICD-10-CM | POA: Insufficient documentation

## 2017-12-26 DIAGNOSIS — R45851 Suicidal ideations: Secondary | ICD-10-CM

## 2017-12-26 DIAGNOSIS — F3130 Bipolar disorder, current episode depressed, mild or moderate severity, unspecified: Secondary | ICD-10-CM | POA: Diagnosis not present

## 2017-12-26 DIAGNOSIS — T50902A Poisoning by unspecified drugs, medicaments and biological substances, intentional self-harm, initial encounter: Secondary | ICD-10-CM

## 2017-12-26 DIAGNOSIS — T428X2A Poisoning by antiparkinsonism drugs and other central muscle-tone depressants, intentional self-harm, initial encounter: Secondary | ICD-10-CM | POA: Insufficient documentation

## 2017-12-26 DIAGNOSIS — F314 Bipolar disorder, current episode depressed, severe, without psychotic features: Secondary | ICD-10-CM

## 2017-12-26 DIAGNOSIS — Z79899 Other long term (current) drug therapy: Secondary | ICD-10-CM

## 2017-12-26 LAB — COMPREHENSIVE METABOLIC PANEL
ALBUMIN: 3.8 g/dL (ref 3.5–5.0)
ALT: 41 U/L (ref 0–44)
AST: 29 U/L (ref 15–41)
Alkaline Phosphatase: 71 U/L (ref 38–126)
Anion gap: 9 (ref 5–15)
BUN: 14 mg/dL (ref 6–20)
CHLORIDE: 102 mmol/L (ref 98–111)
CO2: 19 mmol/L — AB (ref 22–32)
CREATININE: 0.71 mg/dL (ref 0.44–1.00)
Calcium: 8.2 mg/dL — ABNORMAL LOW (ref 8.9–10.3)
GFR calc Af Amer: >60 mL/min (ref >60)
Glucose, Bld: 87 mg/dL (ref 70–99)
POTASSIUM: 3.3 mmol/L — AB (ref 3.5–5.1)
SODIUM: 130 mmol/L — AB (ref 135–145)
Total Bilirubin: 0.4 mg/dL (ref 0.3–1.2)
Total Protein: 6.4 g/dL — ABNORMAL LOW (ref 6.5–8.1)

## 2017-12-26 LAB — I-STAT BETA HCG BLOOD, ED (MC, WL, AP ONLY): I-stat hCG, quantitative: <5 m[IU]/mL (ref <5)

## 2017-12-26 LAB — RAPID URINE DRUG SCREEN, HOSP PERFORMED
Amphetamines: NOT DETECTED
BENZODIAZEPINES: NOT DETECTED
Barbiturates: NOT DETECTED
COCAINE: NOT DETECTED
Opiates: NOT DETECTED
Tetrahydrocannabinol: NOT DETECTED

## 2017-12-26 LAB — CBC
HEMATOCRIT: 39.7 % (ref 36.0–46.0)
Hemoglobin: 12.9 g/dL (ref 12.0–15.0)
MCH: 29.1 pg (ref 26.0–34.0)
MCHC: 32.5 g/dL (ref 30.0–36.0)
MCV: 89.4 fL (ref 80.0–100.0)
NRBC: 0 % (ref 0.0–0.2)
PLATELETS: 137 10*3/uL — AB (ref 150–400)
RBC: 4.44 MIL/uL (ref 3.87–5.11)
RDW: 12 % (ref 11.5–15.5)
WBC: 4.9 10*3/uL (ref 4.0–10.5)

## 2017-12-26 LAB — ACETAMINOPHEN LEVEL: Acetaminophen (Tylenol), Serum: <10 ug/mL — ABNORMAL LOW (ref 10–30)

## 2017-12-26 LAB — SALICYLATE LEVEL

## 2017-12-26 LAB — ETHANOL: ALCOHOL ETHYL (B): 145 mg/dL — AB (ref <10)

## 2017-12-26 NOTE — ED Notes (Signed)
ED Provider at bedside. 

## 2017-12-26 NOTE — ED Notes (Signed)
Family at bedside. 

## 2017-12-26 NOTE — ED Provider Notes (Signed)
[facility] EMERGENCY DEPARTMENT Provider Note   CSN: [id number] Arrival date & time: 12/26/17  1926     History   Chief Complaint Chief Complaint  Patient presents with  . Drug Overdose    HPI Tanya Kelley is a 47 y.o. female.  Patient presents after ingestion of Flexeril pills likely 12 of them in attempt of self-harm this evening.  Patient admitted to significant other and showed empty pill bottle.  No history of similar.  Patient's had some mild depression intermittently however nothing significant like this.  Patient has anemia per report.  Unable to get details from patient as she is too sleepy.     Past Medical History:  Diagnosis Date  . ABDOMINAL BLOATING 04/10/2009   Qualifier: Diagnosis of  By: Nelson-Smith CMA (AAMA), Dottie    . Abdominal pain, unspecified site 04/10/2009   Qualifier: Diagnosis of  By: Nelson-Smith CMA (AAMA), Dottie    . ANEMIA, IRON DEFICIENCY 04/12/2009   Qualifier: Diagnosis of  By: Surface RN, Butch Penny    . B12 DEFICIENCY 04/12/2009   Qualifier: Diagnosis of  By: Surface RN, Butch Penny    . Barrett esophagus 2013  . CERVICAL RADICULOPATHY, RIGHT 06/26/2009   Qualifier: Diagnosis of  By: Lorelei Pont MD, Frederico Hamman    . Chest pain   . Colon polyps   . CONSTIPATION 04/10/2009   Qualifier: Diagnosis of  By: Nelson-Smith CMA (AAMA), Dottie    . COPD 04/10/2009   no per pt 05-25-15  . Diarrhea 04/10/2009   Qualifier: Diagnosis of  By: Nelson-Smith CMA (AAMA), Dottie    . Fatigue   . GERD (gastroesophageal reflux disease)   . Hot flashes   . Hyperlipidemia   . Jaw pain   . MELENA 04/10/2009   Qualifier: Diagnosis of  By: Nelson-Smith CMA (AAMA), Dottie    . Nausea   . PONV (postoperative nausea and vomiting)   . SACROILIITIS, RIGHT 06/26/2009   Qualifier: Diagnosis of  By: Lorelei Pont MD, Frederico Hamman    . Shoulder pain   . WEIGHT LOSS 04/10/2009   Qualifier: Diagnosis of  By: Harlon Ditty CMA (AAMA), Dottie      Patient Active Problem List   Diagnosis Date Noted  . Adjustment disorder with depressed mood 05/09/2017  . Delusional disorder ([place]) 05/09/2017  . Weight loss, unintentional 01/05/2017  . Breast mass 01/05/2017  . Right knee pain 11/19/2015  . Effusion of bursa of right knee 11/19/2015  . Lymphadenitis 05/18/2015  . 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 Hyperlipidemia LDL goal <100 04/09/2015  . Vitamin D deficiency 04/09/2015  . Tobacco use disorder 04/06/2015  . Encounter to establish care 04/06/2015  . Encounter for preventive health examination 04/06/2015  . Barrett esophagus   . Chest pain   . Hot flashes     Past Surgical History:  Procedure Laterality Date  . CHOLECYSTECTOMY    . COLONOSCOPY    . EXCISION MORTON'S NEUROMA Bilateral 01/29/2017   Procedure: Morton's Neuroma Excision [employer];  Surgeon: Wylene Simmer, MD;  Location: [facility];  Service: Orthopedics;  Laterality: Bilateral;  . FOOT SURGERY Bilateral   . TUBAL LIGATION    . UPPER GASTROINTESTINAL ENDOSCOPY  06/12/2015  . WEIL OSTEOTOMY Bilateral 01/29/2017   Procedure: Bilateral Second Metatarsal Weil Osteotomy;  Surgeon: Wylene Simmer, MD;  Location: [facility];  Service: Orthopedics;  Laterality: Bilateral;     OB History    Gravida  3   Para  3  Term      Preterm      AB      Living  3     SAB      TAB      Ectopic      Multiple      Live Births               Home Medications    Prior to Admission medications   Medication Sig Start Date End Date Taking? Authorizing Provider  docusate sodium (COLACE) 100 MG capsule Take 1 capsule (100 mg total) by mouth 2 (two) times daily. While taking narcotic pain medicine. Patient not taking: Reported on 05/08/2017 01/29/17   Corky Sing, PA-C  ibuprofen (ADVIL,MOTRIN) 200 MG tablet Take 200 mg by mouth every 6 (six) hours as needed for headache.    [provider]  ibuprofen (ADVIL,MOTRIN) 600 MG tablet Take 1 tablet (600  mg total) by mouth every 6 (six) hours as needed. Patient not taking: Reported on 05/08/2017 12/05/16   Fransico Meadow, PA-C  oxyCODONE (ROXICODONE) 5 MG immediate release tablet Take 1 tablet (5 mg total) by mouth every 4 (four) hours as needed for moderate pain or severe pain. For no more than 4 days. Patient not taking: Reported on 05/08/2017 01/29/17   Corky Sing, PA-C  senna (SENOKOT) 8.6 MG TABS tablet Take 2 tablets (17.2 mg total) by mouth 2 (two) times daily. Patient not taking: Reported on 05/08/2017 01/29/17   Corky Sing, PA-C  valACYclovir (VALTREX) 1000 MG tablet Take 2 tabs with onset of symptoms and then 2 tabs in 12 hours PRN for cold sore Patient not taking: Reported on 05/08/2017 01/05/17   Ma Hillock, DO    Family History Family History  Problem Relation Age of Onset  . Hypertension Father   . Clotting disorder Father   . Colon polyps Father   . Healthy Mother   . Healthy Brother   . Heart attack Paternal Grandfather   . Heart disease Paternal Grandfather   . Early death Paternal Grandfather 26       MI  . Colon cancer Paternal Grandmother 38       alive  . Clotting disorder Paternal Grandmother   . Colon polyps Paternal Aunt        x 2  . Esophageal cancer Neg Hx   . Rectal cancer Neg Hx   . Stomach cancer Neg Hx     Social History Social History   Tobacco Use  . Smoking status: Current Some Day Smoker    Packs/day: 0.00    Years: 27.00    Pack years: 0.00    Types: Cigarettes  . Smokeless tobacco: Never Used  . Tobacco comment: only smokes occasionally, with drinks  Substance Use Topics  . Alcohol use: Yes    Comment: occassional   . Drug use: No     Allergies   Patient has no known allergies.   Review of Systems Review of Systems  Unable to perform ROS: Mental status change     Physical Exam Updated Vital Signs BP 107/75   Pulse 84   Temp 97.8 F (36.6 C) (Oral)   Resp (!) 26   Wt 63.5 kg   LMP 05/18/2015    SpO2 100%   BMI 21.29 kg/m   Physical Exam  Constitutional: She appears well-developed and well-nourished.  HENT:  Head: Normocephalic and atraumatic.  Eyes: Conjunctivae are normal. Right eye exhibits no discharge.  Left eye exhibits no discharge.  Neck: Normal range of motion. Neck supple. No tracheal deviation present.  Cardiovascular: Normal rate and regular rhythm.  Pulmonary/Chest: Effort normal and breath sounds normal.  Abdominal: Soft. She exhibits no distension. There is no tenderness. There is no guarding.  Musculoskeletal: She exhibits no edema.  Neurological: She is alert.  Patient follows commands.  Pupils equal bilateral.  Horizontal nystagmus bilateral.  Extraocular muscle function intact.  Patient moves all extremities with general weakness bilateral.  Skin: Skin is warm. No rash noted.  Psychiatric: She expresses suicidal ideation.  Sleepy and depressed  Nursing note and vitals reviewed.    ED Treatments / Results  Labs (all labs ordered are listed, but only abnormal results are displayed) Labs Reviewed  COMPREHENSIVE METABOLIC PANEL - Abnormal; Notable for the following components:      Result Value   Sodium 130 (*)    Potassium 3.3 (*)    CO2 19 (*)    Calcium 8.2 (*)    Total Protein 6.4 (*)    All other components within normal limits  ETHANOL - Abnormal; Notable for the following components:   Alcohol, Ethyl (B) 145 (*)    All other components within normal limits  ACETAMINOPHEN LEVEL - Abnormal; Notable for the following components:   Acetaminophen (Tylenol), Serum <10 (*)    All other components within normal limits  CBC - Abnormal; Notable for the following components:   Platelets 137 (*)    All other components within normal limits  SALICYLATE LEVEL  RAPID URINE DRUG SCREEN, HOSP PERFORMED  I-STAT BETA HCG BLOOD, ED ([facility], [facility], [facility] ONLY)  CBG MONITORING, ED    EKG None  Radiology No results found.  Procedures Procedures (including  critical care time)  Medications Ordered in ED Medications - No data to display   Initial Impression / Assessment and Plan / ED Course  I have reviewed the triage vital signs and the nursing notes.  Pertinent labs & imaging results that were available during my care of the patient were reviewed by me and considered in my medical decision making (see chart for details).    Patient presents after suicide attempt with Flexeril.  Plan for medical observation in the ER, discussed with poison control for likely 6-hour observation, reviewed blood work and consult to psychiatry once patient able to. Discussed with poison control, recommended 6 hours observation, Ativan as needed.  Mild decreased potassium and sodium.  Patient be observed in the E and plan to sign patient out to follow clinically.  Patient has elevated alcohol level.  Plan for psych assessment once medically clear.  IVC paperwork filled out by myself.    Final Clinical Impressions(s) / ED Diagnoses   Final diagnoses:  Drug overdose, intentional self-harm, initial encounter [facility])    ED Discharge Orders    None       Elnora Morrison, MD 12/26/17 2219

## 2017-12-26 NOTE — ED Triage Notes (Signed)
Patient states that she took "10-15" 10mg  cyclobenzaprines because she was having "bad thoughts". When asking her what the bad thoughts were; she states "I can't tell you".

## 2017-12-27 ENCOUNTER — Encounter (HOSPITAL_COMMUNITY): Payer: Self-pay | Admitting: *Deleted

## 2017-12-27 ENCOUNTER — Other Ambulatory Visit: Payer: Self-pay | Admitting: Registered Nurse

## 2017-12-27 ENCOUNTER — Inpatient Hospital Stay (HOSPITAL_COMMUNITY)
Admission: AD | Admit: 2017-12-27 | Discharge: 2018-01-01 | DRG: 885 | Disposition: A | Discharge status: Home / Self Care | Payer: BLUE CROSS/BLUE SHIELD | Attending: Psychiatry | Admitting: Psychiatry

## 2017-12-27 DIAGNOSIS — E7800 Pure hypercholesterolemia, unspecified: Secondary | ICD-10-CM | POA: Diagnosis present

## 2017-12-27 DIAGNOSIS — G4700 Insomnia, unspecified: Secondary | ICD-10-CM | POA: Diagnosis present

## 2017-12-27 DIAGNOSIS — F600 Paranoid personality disorder: Secondary | ICD-10-CM | POA: Diagnosis present

## 2017-12-27 DIAGNOSIS — E785 Hyperlipidemia, unspecified: Secondary | ICD-10-CM | POA: Diagnosis present

## 2017-12-27 DIAGNOSIS — T481X2A Poisoning by skeletal muscle relaxants [neuromuscular blocking agents], intentional self-harm, initial encounter: Secondary | ICD-10-CM | POA: Diagnosis present

## 2017-12-27 DIAGNOSIS — F17210 Nicotine dependence, cigarettes, uncomplicated: Secondary | ICD-10-CM | POA: Diagnosis present

## 2017-12-27 DIAGNOSIS — K219 Gastro-esophageal reflux disease without esophagitis: Secondary | ICD-10-CM | POA: Diagnosis present

## 2017-12-27 DIAGNOSIS — F315 Bipolar disorder, current episode depressed, severe, with psychotic features: Secondary | ICD-10-CM | POA: Diagnosis not present

## 2017-12-27 DIAGNOSIS — F3130 Bipolar disorder, current episode depressed, mild or moderate severity, unspecified: Principal | ICD-10-CM | POA: Diagnosis present

## 2017-12-27 DIAGNOSIS — F419 Anxiety disorder, unspecified: Secondary | ICD-10-CM | POA: Diagnosis present

## 2017-12-27 DIAGNOSIS — F333 Major depressive disorder, recurrent, severe with psychotic symptoms: Secondary | ICD-10-CM | POA: Diagnosis not present

## 2017-12-27 DIAGNOSIS — Z915 Personal history of self-harm: Secondary | ICD-10-CM | POA: Diagnosis not present

## 2017-12-27 DIAGNOSIS — Z23 Encounter for immunization: Secondary | ICD-10-CM | POA: Diagnosis not present

## 2017-12-27 DIAGNOSIS — F22 Delusional disorders: Secondary | ICD-10-CM | POA: Diagnosis present

## 2017-12-27 LAB — URINALYSIS, COMPLETE (UACMP) WITH MICROSCOPIC
BACTERIA UA: NONE SEEN
BILIRUBIN URINE: NEGATIVE
Glucose, UA: NEGATIVE mg/dL
KETONES UR: NEGATIVE mg/dL
Leukocytes, UA: NEGATIVE
Nitrite: NEGATIVE
PROTEIN: NEGATIVE mg/dL
Specific Gravity, Urine: 1.023 (ref 1.005–1.030)
pH: 6 (ref 5.0–8.0)

## 2017-12-27 MED ORDER — INFLUENZA VAC SPLIT QUAD 0.5 ML IM SUSY
0.5000 mL | PREFILLED_SYRINGE | INTRAMUSCULAR | Status: AC
  Administered 2017-12-28: 0.5 mL via INTRAMUSCULAR
  Filled 2017-12-27: qty 0.5

## 2017-12-27 MED ORDER — POTASSIUM CHLORIDE CRYS ER 10 MEQ PO TBCR
10.0000 meq | EXTENDED_RELEASE_TABLET | Freq: Two times a day (BID) | ORAL | Status: AC
  Administered 2017-12-27 – 2017-12-28 (×2): 10 meq via ORAL
  Filled 2017-12-27 (×4): qty 1

## 2017-12-27 MED ORDER — QUETIAPINE FUMARATE 50 MG PO TABS
50.0000 mg | ORAL_TABLET | Freq: Every day | ORAL | Status: DC
  Administered 2017-12-27 – 2017-12-28 (×2): 50 mg via ORAL
  Filled 2017-12-27 (×4): qty 1

## 2017-12-27 MED ORDER — ENSURE ENLIVE PO LIQD
237.0000 mL | Freq: Two times a day (BID) | ORAL | Status: DC
  Administered 2017-12-28 – 2018-01-01 (×8): 237 mL via ORAL

## 2017-12-27 MED ORDER — PNEUMOCOCCAL VAC POLYVALENT 25 MCG/0.5ML IJ INJ
0.5000 mL | INJECTION | INTRAMUSCULAR | Status: AC
  Administered 2017-12-28: 0.5 mL via INTRAMUSCULAR

## 2017-12-27 MED ORDER — ESCITALOPRAM OXALATE 10 MG PO TABS
10.0000 mg | ORAL_TABLET | Freq: Every day | ORAL | Status: DC
  Administered 2017-12-28 – 2017-12-29 (×2): 10 mg via ORAL
  Filled 2017-12-27 (×3): qty 1

## 2017-12-27 MED ORDER — NICOTINE 21 MG/24HR TD PT24
21.0000 mg | MEDICATED_PATCH | Freq: Every day | TRANSDERMAL | Status: DC
  Administered 2017-12-27: 21 mg via TRANSDERMAL
  Filled 2017-12-27: qty 1

## 2017-12-27 MED ORDER — LORAZEPAM 0.5 MG PO TABS: 0.5000 mg | ORAL_TABLET | Freq: Four times a day (QID) | ORAL | Status: DC | PRN

## 2017-12-27 NOTE — ED Notes (Signed)
Report given to Monique, RN

## 2017-12-27 NOTE — Tx Team (Addendum)
Initial Treatment Plan 12/27/2017 5:05 PM Tanya Kelley CYO:[id number]    PATIENT STRESSORS: Educational concerns Financial difficulties Health problems   PATIENT STRENGTHS: Ability for insight Active sense of humor Average or above average intelligence   PATIENT IDENTIFIED PROBLEMS: MDD with SI  PTSD  S/p Flexeril OD wuth suicide attempt  Barrett esophagus  GERD        " I know it happened"  " My experience here was horrible"   DISCHARGE CRITERIA:  Ability to meet basic life and health needs Adequate post-discharge living arrangements  PRELIMINARY DISCHARGE PLAN: Attend aftercare/continuing care group  PATIENT/FAMILY INVOLVEMENT: This treatment plan has been presented to and reviewed with the patient, Tanya Kelley, and/or family member,  The patient and family have been given the opportunity to ask questions and make suggestions.  Lauralyn Primes, RN 12/27/2017, 5:05 PM

## 2017-12-27 NOTE — ED Notes (Signed)
Sam with [facility] called said patient will be treated inpatient once she is medically cleared.  RN advised MD.

## 2017-12-27 NOTE — ED Notes (Signed)
Lunch tray ordered. Daughter at bedside sitter at bedside.

## 2017-12-27 NOTE — ED Notes (Signed)
Patient belongings has been inventoried and placed in locker #1; valuables sent to [facility]

## 2017-12-27 NOTE — Plan of Care (Signed)
  Problem: Education: Goal: Knowledge of [facility] Education information/materials will improve Outcome: Progressing   

## 2017-12-27 NOTE — ED Notes (Signed)
ALL of pt's belongings - 1 labeled belongings bag and 1 valuables envelope - given to pt's daughter as requested by pt.

## 2017-12-27 NOTE — Plan of Care (Signed)
  Problem: Education: Goal: Knowledge of  General Education information/materials will improve Outcome: Progressing   Problem: Safety: Goal: Periods of time without injury will increase Outcome: Progressing   Patient oriented to the unit. Patient remains safe and will continue to monitor.  

## 2017-12-27 NOTE — ED Notes (Signed)
Daughter visiting w/pt. Both aware pt has been accepted to [facility]. Pt asking if she may leave - advised pt no d/t IVC - voiced understanding.

## 2017-12-27 NOTE — ED Notes (Addendum)
Pt noted to be alert, oriented, calm, cooperative - eating breakfast. Voiced understanding and agreement w/tx plan - seeking Inpt tx. States "I still feel a little groggy". States she remembers taking Flexeril last pm but does not recall much of what occurred afterward - other than she states she thinks she remembers her spouse and daughter arguing over her staying in the hospital. States she performed self-hypnosis via Youtube for 8 hours one night in 03/2017 d/t trying to get help w/her anger issues. States she has had depression issues since then.

## 2017-12-27 NOTE — ED Notes (Signed)
Daughter escorted to room by security belongings placed in locker.

## 2017-12-27 NOTE — ED Notes (Signed)
3 copies of IVC papers placed on chart and original placed in red folder in PODF; Copy faxed to [facility] and placed in medical records by [facility]
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
## 2017-12-27 NOTE — BHH Suicide Risk Assessment (Signed)
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 Bipolar disorder current episode depressed ([place]) [F31.30] 12/27/2017  . Adjustment disorder with depressed mood [F43.21] 05/09/2017  . Delusional disorder ([place]) [F22] 05/09/2017  . Weight loss, unintentional [R63.4] 01/05/2017  . Breast mass [N63.0] 01/05/2017  . Right knee pain [M25.561] 11/19/2015  . Effusion of bursa of right knee [M25.461] 11/19/2015  . Lymphadenitis [I88.9] 05/18/2015  . 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 Hyperlipidemia LDL goal <100 [E78.5] 04/09/2015  . Vitamin D deficiency [E55.9] 04/09/2015  . Tobacco use disorder [F17.200] 04/06/2015  . Encounter to establish care [Z76.89] 04/06/2015  . Encounter for preventive health examination [Z00.00] 04/06/2015  . 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 Chest pain [R07.9]   . Hot flashes [R23.2]    Subjective Data:   Continued Clinical Symptoms:    The "Alcohol Use Disorders Identification Test", Guidelines for Use in Primary Care, Second Edition.  World Pharmacologist [facility]). Score between 0-7:  no or low risk or alcohol related problems. Score between 8-15:  moderate risk of alcohol related problems. Score between 16-19:  high risk of alcohol related problems. Score 20 or above:  warrants further diagnostic evaluation for alcohol dependence and treatment.   CLINICAL FACTORS:  47 year old married female, presented following suicide attempt by overdosing on flexeril and alcohol. Reports worsening depression, anxiety, neuro-vegetative  symptoms of depression. States she has experienced a number of " recovered " memories from traumatic childhood and adulthood events particularly after participating in an internet based hypnosis program. States that in spite of assurances from family that these events did not occur, she ruminates about them, feels guilty, and worries she may be ostracized by people or arrested for things she allegedly did in the past .    Psychiatric Specialty Exam: Physical Exam  ROS  Last menstrual period 05/18/2015.There is no height or weight on file to calculate BMI.  See admit note MSE  COGNITIVE FEATURES THAT CONTRIBUTE TO RISK:  Closed-mindedness and Loss of executive function    SUICIDE RISK:   Moderate:  Frequent suicidal ideation with limited intensity, and duration, some specificity in terms of plans, no associated intent, good self-control, limited dysphoria/symptomatology, some risk factors present, and identifiable protective factors, including available and accessible social support.  PLAN OF CARE: Patient will be admitted to inpatient psychiatric unit for stabilization and safety. Will provide and encourage milieu participation. Provide medication management and maked adjustments as needed.  Will follow daily.    I certify that inpatient services furnished can reasonably be expected to improve the patient's condition.   Jenne Campus, MD 12/27/2017, 6:15 PM
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
## 2017-12-27 NOTE — Progress Notes (Addendum)
Per Doree Albee, pt has been accepted to [facility] bed 403-02. Accepting provider is Lindon Romp, Attending provider is Dr. Parke Poisson MD. Patient can arrive at 12:00PM today. Number for report is [phone]. Eber Hong RN made aware.   Maxie Better, MSW, LCSW Clinical Social Worker 12/27/2017 9:22 AM

## 2017-12-27 NOTE — ED Notes (Signed)
Pt up to restroom and returns to bed. Sitter at bedside.

## 2017-12-27 NOTE — [facility] Group Notes (Signed)
Pt attended and participated in wrap up group this evening.

## 2017-12-27 NOTE — ED Provider Notes (Signed)
The pt has been cleared for psych evaluation at this time. 3:09 AM    Noemi Chapel, MD 12/27/17 [phone]

## 2017-12-27 NOTE — Plan of Care (Signed)
  Problem: Education: Goal: Knowledge of [facility] Education information/materials will improve Outcome: Progressing Goal: Verbalization of understanding the information provided will improve Outcome: Progressing   

## 2017-12-27 NOTE — ED Notes (Signed)
Pt ambulated to bathroom and back to room w/o difficulty. TV remote given as requested.
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
## 2017-12-27 NOTE — ED Notes (Signed)
Poison Control contacted RN and said if she was negative for anticholinergic effects and has positive bowels sounds along with being alert and oriented she could be medically cleared for [facility].  Patient is alert, oriented X4 and following commands.  Patient advised she feels weak and sleepy so tech/sitter at the bedside put her on the bed pan.   Rn advised MD of poison control's recommendations.

## 2017-12-27 NOTE — ED Notes (Signed)
Pt sleeping sitter remains at bedside °

## 2017-12-27 NOTE — ED Notes (Signed)
Advised daughter of visitation time being over - voiced understanding. Pt and daughter voiced understanding and agreement w/tx plan - accepted to [facility].
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
## 2017-12-27 NOTE — ED Notes (Signed)
Pt received breakfast tray 

## 2017-12-27 NOTE — ED Notes (Signed)
Pt awake - given [place] sandwich as requested.

## 2017-12-27 NOTE — [facility] Group Notes (Addendum)
Adult Nursing Psychoeducational Group Note  Date:  12/27/2017  Time: 2:30 pm  Group Topic/Focus: Life Skills The purpose of this group is to help patients identify strategies for coping with mental health crisis.  Group discusses possible causes of crisis and ways to manage them effectively. Facilitated By: Tressie Ellis RN  Participation Level:  Did Not Attend  Additional Comments:  Patient was meeting with SW for a consult.  Tanya Kelley A Decarlos Empey 12/27/2017 2:30 pm

## 2017-12-28 LAB — LIPID PANEL
CHOLESTEROL: 227 mg/dL — AB (ref 0–200)
HDL: 40 mg/dL — AB (ref >40)
LDL Cholesterol: 166 mg/dL — ABNORMAL HIGH (ref 0–99)
TRIGLYCERIDES: 105 mg/dL (ref <150)
Total CHOL/HDL Ratio: 5.7 RATIO
VLDL: 21 mg/dL (ref 0–40)

## 2017-12-28 LAB — HEMOGLOBIN A1C
Hgb A1c MFr Bld: 5.1 % (ref 4.8–5.6)
Mean Plasma Glucose: 99.67 mg/dL

## 2017-12-28 LAB — BASIC METABOLIC PANEL
Anion gap: 6 (ref 5–15)
BUN: 15 mg/dL (ref 6–20)
CHLORIDE: 108 mmol/L (ref 98–111)
CO2: 28 mmol/L (ref 22–32)
Calcium: 8.9 mg/dL (ref 8.9–10.3)
Creatinine, Ser: 0.7 mg/dL (ref 0.44–1.00)
Glucose, Bld: 87 mg/dL (ref 70–99)
POTASSIUM: 4.3 mmol/L (ref 3.5–5.1)
SODIUM: 142 mmol/L (ref 135–145)

## 2017-12-28 LAB — TSH: TSH: 2.571 u[IU]/mL (ref 0.350–4.500)

## 2017-12-28 MED ORDER — NICOTINE 21 MG/24HR TD PT24
21.0000 mg | MEDICATED_PATCH | Freq: Every day | TRANSDERMAL | Status: DC
  Administered 2017-12-28 – 2018-01-01 (×5): 21 mg via TRANSDERMAL
  Filled 2017-12-28 (×7): qty 1

## 2017-12-28 MED ORDER — GUAIFENESIN ER 600 MG PO TB12
600.0000 mg | ORAL_TABLET | Freq: Two times a day (BID) | ORAL | Status: DC
  Administered 2017-12-28 – 2018-01-01 (×9): 600 mg via ORAL
  Filled 2017-12-28 (×13): qty 1

## 2017-12-28 NOTE — Plan of Care (Signed)
  Problem: Education: Goal: Knowledge of [facility] Education information/materials will improve Outcome: Progressing Goal: Emotional status will improve Outcome: Progressing   Problem: Activity: Goal: Interest or engagement in activities will improve Outcome: Progressing   Problem: Safety: Goal: Periods of time without injury will increase Outcome: Progressing   Patient oriented to the unit. Patient denies Si. Patient attended groups, meals, and time in the dayroom today. Patient remains safe and will continue to monitor.

## 2017-12-28 NOTE — Progress Notes (Addendum)
[facility] MD Progress Note  12/28/2017 11:55 AM Tanya Kelley  MRN:  [id number] Subjective: Patient reports feeling better today.  Denies suicidal ideations.  Thus far no medication side effects.  Describes mild nasal congestion, no fever, no dyspnea. Reports she had a good visit from family yesterday evening. Objective: I have discussed case with treatment team and have met with patient. 47 year old married female, presented following suicide attempt by overdosing on flexeril and alcohol. Reports worsening depression, anxiety, neuro-vegetative symptoms of depression. States she has experienced a number of " recovered " memories from traumatic childhood and adulthood events particularly after participating in an internet based hypnosis program. States that in spite of assurances from family that these events did not occur, she ruminates about them, feels guilty, and worries she may be ostracized by people or arrested for things she allegedly did in the past .  Reports feeling better than she did prior to admission.  Today seems less focused on disturbing memories or on paranoid ideations.  States she feels safe on unit. Currently on Lexapro and Seroquel-currently tolerating well, except for mild sedation. No disruptive or agitated behaviors on unit.  Calm/cooperative on approach. EKG NSR, QTc 445 BMP unremarkable, mild hypercholesterolemia.  TSH WNL. UA- no leukocytes, no nitrites, no bacteria Principal Problem:  MDD with Psychotic Features  Diagnosis:   Patient Active Problem List   Diagnosis Date Noted  . Bipolar disorder current episode depressed ([place]) [F31.30] 12/27/2017  . Adjustment disorder with depressed mood [F43.21] 05/09/2017  . Delusional disorder ([place]) [F22] 05/09/2017  . Weight loss, unintentional [R63.4] 01/05/2017  . Breast mass [N63.0] 01/05/2017  . Right knee pain [M25.561] 11/19/2015  . Effusion of bursa of right knee [M25.461] 11/19/2015  . Lymphadenitis [I88.9] 05/18/2015   . 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 Hyperlipidemia LDL goal <100 [E78.5] 04/09/2015  . Vitamin D deficiency [E55.9] 04/09/2015  . Tobacco use disorder [F17.200] 04/06/2015  . Encounter to establish care [Z76.89] 04/06/2015  . Encounter for preventive health examination [Z00.00] 04/06/2015  . 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 Chest pain [R07.9]   . Hot flashes [R23.2]    Total Time spent with patient: 20 minutes  Past Psychiatric History:   Past Medical History:  Past Medical History:  Diagnosis Date  . ABDOMINAL BLOATING 04/10/2009   Qualifier: Diagnosis of  By: Nelson-Smith CMA (AAMA), Dottie    . Abdominal pain, unspecified site 04/10/2009   Qualifier: Diagnosis of  By: Nelson-Smith CMA (AAMA), Dottie    . ANEMIA, IRON DEFICIENCY 04/12/2009   Qualifier: Diagnosis of  By: Surface RN, Butch Penny    . B12 DEFICIENCY 04/12/2009   Qualifier: Diagnosis of  By: Surface RN, Butch Penny    . Barrett esophagus 2013  . CERVICAL RADICULOPATHY, RIGHT 06/26/2009   Qualifier: Diagnosis of  By: Lorelei Pont MD, Frederico Hamman    . Chest pain   . Colon polyps   . CONSTIPATION 04/10/2009   Qualifier: Diagnosis of  By: Nelson-Smith CMA (AAMA), Dottie    . COPD 04/10/2009   no per pt 05-25-15  . Diarrhea 04/10/2009   Qualifier: Diagnosis of  By: Nelson-Smith CMA (AAMA), Dottie    . Fatigue   . GERD (gastroesophageal reflux disease)   . Hot flashes   . Hyperlipidemia   . Jaw pain   . MELENA 04/10/2009   Qualifier: Diagnosis of  By: Nelson-Smith CMA (AAMA), Dottie    . Nausea   . PONV (postoperative nausea and vomiting)   . SACROILIITIS, RIGHT  06/26/2009   Qualifier: Diagnosis of  By: Lorelei Pont MD, Frederico Hamman    . Shoulder pain   . WEIGHT LOSS 04/10/2009   Qualifier: Diagnosis of  By: Harlon Ditty CMA (AAMA), Dottie      Past Surgical History:  Procedure Laterality Date  . CHOLECYSTECTOMY    . COLONOSCOPY    . EXCISION MORTON'S NEUROMA Bilateral 01/29/2017   Procedure: Morton's Neuroma Excision [employer];   Surgeon: Wylene Simmer, MD;  Location: [place];  Service: Orthopedics;  Laterality: Bilateral;  . FOOT SURGERY Bilateral   . TUBAL LIGATION    . UPPER GASTROINTESTINAL ENDOSCOPY  06/12/2015  . WEIL OSTEOTOMY Bilateral 01/29/2017   Procedure: Bilateral Second Metatarsal Weil Osteotomy;  Surgeon: Wylene Simmer, MD;  Location: [place];  Service: Orthopedics;  Laterality: Bilateral;   Family History:  Family History  Problem Relation Age of Onset  . Hypertension Father   . Clotting disorder Father   . Colon polyps Father   . Healthy Mother   . Healthy Brother   . Heart attack Paternal Grandfather   . Heart disease Paternal Grandfather   . Early death Paternal Grandfather 62       MI  . Colon cancer Paternal Grandmother 49       alive  . Clotting disorder Paternal Grandmother   . Colon polyps Paternal Aunt        x 2  . Esophageal cancer Neg Hx   . Rectal cancer Neg Hx   . Stomach cancer Neg Hx    Family Psychiatric  History: There Social History:  Social History   Substance and Sexual Activity  Alcohol Use Yes   Comment: occassional      Social History   Substance and Sexual Activity  Drug Use No    Social History   Socioeconomic History  . Marital status: Married    Spouse name: Not on file  . Number of children: 3  . Years of education: Not on file  . Highest education level: Not on file  Occupational History  . Not on file  Social Needs  . Financial resource strain: Not hard at all  . Food insecurity:    Worry: Never true    Inability: Never true  . Transportation needs:    Medical: No    Non-medical: No  Tobacco Use  . Smoking status: Current Some Day Smoker    Packs/day: 0.00    Years: 27.00    Pack years: 0.00    Types: Cigarettes  . Smokeless tobacco: Never Used  . Tobacco comment: only smokes occasionally, with drinks  Substance and Sexual Activity  . Alcohol use: Yes    Comment: occassional   . Drug use: No   . Sexual activity: Yes    Birth control/protection: Surgical    Comment: Tubal 2000  Lifestyle  . Physical activity:    Days per week: 0 days    Minutes per session: 0 min  . Stress: Very much  Relationships  . Social connections:    Talks on phone: Twice a week    Gets together: Twice a week    Attends religious service: 1 to 4 times per year    Active member of club or organization: No    Attends meetings of clubs or organizations: Never    Relationship status: Married  Other Topics Concern  . Not on file  Social History Narrative   Married, Lorin Mercy. 3 children Bobette Mo, [place], [place]).  Some college. 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    Drinks caffeine rarely,  Has recently switched to decaf.   Wears seatbelt, smoke detector in the home.    Firearms in a locked case.    Feels safe in relationships.    Additional Social History:    Pain Medications: Please see MAR Prescriptions: Please see MAR Over the Counter: Please see MAR History of alcohol / drug use?: No history of alcohol / drug abuse Longest period of sobriety (when/how long): Pt denies Negative Consequences of Use: Legal, Personal relationships Withdrawal Symptoms: Change in blood pressure  Sleep: Fair - improving  Appetite:  Good  Current Medications: Current Facility-Administered Medications  Medication Dose Route Frequency Provider Last Rate Last Dose  . escitalopram (LEXAPRO) tablet 10 mg  10 mg Oral Daily Cobos, Myer Peer, MD   10 mg at 12/28/17 0806  . feeding supplement (ENSURE ENLIVE) (ENSURE ENLIVE) liquid 237 mL  237 mL Oral BID BM Cobos, Myer Peer, MD   237 mL at 12/28/17 0931  . guaiFENesin (MUCINEX) 12 hr tablet 600 mg  600 mg Oral BID Lindell Spar I, NP      . Influenza vac split quadrivalent PF (FLUARIX) injection 0.5 mL  0.5 mL Intramuscular Tomorrow-1000 Cobos, Fernando A, MD      . LORazepam (ATIVAN) tablet 0.5 mg  0.5 mg Oral Q6H PRN Cobos, Myer Peer, MD      .  nicotine (NICODERM CQ - dosed in mg/24 hours) patch 21 mg  21 mg Transdermal Daily Cobos, Myer Peer, MD   21 mg at 12/28/17 0930  . QUEtiapine (SEROQUEL) tablet 50 mg  50 mg Oral QHS Cobos, Myer Peer, MD   50 mg at 12/27/17 2203    Lab Results:  Results for orders placed or performed during the hospital encounter of 12/27/17 (from the past 48 hour(s))  Urinalysis, Complete w Microscopic     Status: Abnormal   Collection Time: 12/27/17  6:37 PM  Result Value Ref Range   Color, Urine YELLOW YELLOW   APPearance HAZY (A) CLEAR   Specific Gravity, Urine 1.023 1.005 - 1.030   pH 6.0 5.0 - 8.0   Glucose, UA NEGATIVE NEGATIVE mg/dL   Hgb urine dipstick SMALL (A) NEGATIVE   Bilirubin Urine NEGATIVE NEGATIVE   Ketones, ur NEGATIVE NEGATIVE mg/dL   Protein, ur NEGATIVE NEGATIVE mg/dL   Nitrite NEGATIVE NEGATIVE   Leukocytes, UA NEGATIVE NEGATIVE   RBC / HPF 0-5 0 - 5 RBC/hpf   WBC, UA 0-5 0 - 5 WBC/hpf   Bacteria, UA NONE SEEN NONE SEEN   Squamous Epithelial / LPF 6-10 0 - 5   Mucus PRESENT     Comment: Performed at [facility], [place] [address], [place], [place] [address]  Lipid panel     Status: Abnormal   Collection Time: 12/28/17  6:24 AM  Result Value Ref Range   Cholesterol 227 (H) 0 - 200 mg/dL   Triglycerides 105 <150 mg/dL   HDL 40 (L) >40 mg/dL   Total CHOL/HDL Ratio 5.7 RATIO   VLDL 21 0 - 40 mg/dL   LDL Cholesterol 166 (H) 0 - 99 mg/dL    Comment:        Total Cholesterol/HDL:CHD Risk Coronary Heart Disease Risk Table                     Men   Women  1/2 Average Risk   3.4  3.3  Average Risk       5.0   4.4  2 X Average Risk   9.6   7.1  3 X Average Risk  23.4   11.0        Use the calculated Patient Ratio above and the CHD Risk Table to determine the patient's CHD Risk.        ATP III CLASSIFICATION (LDL):  <100     mg/dL   Optimal  100-129  mg/dL   Near or Above                    Optimal  130-159  mg/dL   Borderline  160-189  mg/dL    High  >190     mg/dL   Very High Performed at [facility] [address], [place], [place] [address]   Hemoglobin A1c     Status: None   Collection Time: 12/28/17  6:24 AM  Result Value Ref Range   Hgb A1c MFr Bld 5.1 4.8 - 5.6 %    Comment: (NOTE) Pre diabetes:          5.7%-6.4% Diabetes:              >6.4% Glycemic control for   <7.0% adults with diabetes    Mean Plasma Glucose 99.67 mg/dL    Comment: Performed at [facility] [address], [place], [place] [address]  TSH     Status: None   Collection Time: 12/28/17  6:24 AM  Result Value Ref Range   TSH 2.571 0.350 - 4.500 uIU/mL    Comment: Performed by a 3rd Generation assay with a functional sensitivity of <=0.01 uIU/mL. Performed at [facility], [place] [address], [place], [place] [address]   Basic metabolic panel     Status: None   Collection Time: 12/28/17  6:24 AM  Result Value Ref Range   Sodium 142 135 - 145 mmol/L   Potassium 4.3 3.5 - 5.1 mmol/L   Chloride 108 98 - 111 mmol/L   CO2 28 22 - 32 mmol/L   Glucose, Bld 87 70 - 99 mg/dL   BUN 15 6 - 20 mg/dL   Creatinine, Ser 0.70 0.44 - 1.00 mg/dL   Calcium 8.9 8.9 - 10.3 mg/dL   GFR calc non Af Amer >60 >60 mL/min   GFR calc Af Amer >60 >60 mL/min    Comment: (NOTE) The eGFR has been calculated using the CKD EPI equation. This calculation has not been validated in all clinical situations. eGFR's persistently <60 mL/min signify possible Chronic Kidney Disease.    Anion gap 6 5 - 15    Comment: Performed at [facility], [place] [address] [place], [place] [address]    Blood Alcohol level:  Lab Results  Component Value Date   ETH 145 (H) 12/26/2017   ETH <10 17/79/3903    Metabolic Disorder Labs: Lab Results  Component Value Date   HGBA1C 5.1 12/28/2017   MPG 99.67 12/28/2017   No results found for: PROLACTIN Lab Results  Component Value Date   CHOL 227 (H) 12/28/2017   TRIG 105  12/28/2017   HDL 40 (L) 12/28/2017   CHOLHDL 5.7 12/28/2017   VLDL 21 12/28/2017   LDLCALC 166 (H) 12/28/2017   LDLCALC 190 (H) 04/06/2015    Physical Findings: AIMS: Facial and Oral Movements Muscles of Facial Expression: None, normal Lips and Perioral Area: None, normal Jaw: None, normal  Tongue: None, normal,Extremity Movements Upper (arms, wrists, hands, fingers): None, normal Lower (legs, knees, ankles, toes): None, normal, Trunk Movements Neck, shoulders, hips: None, normal, Overall Severity Severity of abnormal movements (highest score from questions above): None, normal Incapacitation due to abnormal movements: None, normal Patient's awareness of abnormal movements (rate only patient's report): No Awareness, Dental Status Current problems with teeth and/or dentures?: No Does patient usually wear dentures?: No  CIWA:  CIWA-Ar Total: 2 COWS:  COWS Total Score: 2  Musculoskeletal: Strength & Muscle Tone: within normal limits Gait & Station: normal Patient leans: N/A  Psychiatric Specialty Exam: Physical Exam  ROS denies headache, some nasal congestion, no fever, no chest pain, no dyspnea, no fever, no chills   Blood pressure 103/75, pulse 93, temperature 98.5 F (36.9 C), temperature source Oral, resp. rate 17, height _0  (1.727 m), weight 64.9 kg, last menstrual period 05/18/2015, SpO2 98 %.Body mass index is 21.74 kg/m.  General Appearance: Improving grooming  Eye Contact:  Good  Speech:  Normal Rate  Volume:  Normal  Mood:  Reports feeling better, remains vaguely depressed, anxious  Affect:  Congruent  Thought Process:  Linear and Descriptions of Associations: Intact  Orientation:  Other:  Fully alert and attentive  Thought Content:  No hallucinations, no delusions expressed today, states she feels safe on unit  Suicidal Thoughts:  No denies suicidal ideations, no self-injurious ideations, contracts for safety on unit, no homicidal or violent ideations   Homicidal Thoughts:  No  Memory:  Recent and remote grossly intact  Judgement:  Fair  Insight:  Fair  Psychomotor Activity:  Normal  Concentration:  Concentration: Good and Attention Span: Good  Recall:  Good  Fund of Knowledge:  Good  Language:  Good  Akathisia:  Negative  Handed:  Right  AIMS (if indicated):     Assets:  Communication Skills Desire for Improvement Resilience  ADL's:  Intact  Cognition:  WNL  Sleep:  Number of Hours: 5.75   Assessment- 47 year old married female, presented following suicide attempt by overdosing on flexeril and alcohol. Reports worsening depression, anxiety, neuro-vegetative symptoms of depression. States she has experienced a number of " recovered " memories from traumatic childhood and adulthood events particularly after participating in an internet based hypnosis program. States that in spite of assurances from family that these events did not occur, she ruminates about them, feels guilty, and worries she may be ostracized by people or arrested for things she allegedly did in the past .  Today patient presents with some improvement , although remains depressed with a vaguely anxious, constricted affect . Less focused on intrusive /traumatic memories today, and today not expressing delusional or persecutory ideations . States she is feeling safe on inpatient environment. Currently on Lexapro and Seroquel, which she is tolerating well thus far . Denies suicidal ideations .  Treatment Plan Summary: Daily contact with patient to assess and evaluate symptoms and progress in treatment, Medication management, Plan inpatient treatment and medications as below Encourage group and milieu participation to work on coping skills and symptom reduction Treatment team working on disposition planning options Continue Ativan 0.5 mg every 6 hours PRN for anxiety as needed Continue Seroquel 50 mg nightly for mood disorder /psychosis Continue Lexapro 10 mg daily for  depression and anxiety   Jenne Campus, MD 12/28/2017, 11:55 AM

## 2017-12-28 NOTE — Plan of Care (Signed)
Progress Note  D: pt found in bed reading; compliant with medication administration. Pt states she slept fair. Pt rates her depression/hopelessness/anxiety a 4/3/? Out of 10 respectively. Pt denies any physical symptoms or pain, rating this a 0/10. Pt states her goal for today is to get well so she can go home. Pt states she will achieve this by participating in group medicine. Pt denies any si/hi/ah/vh and verbally agrees to approach staff if these become apparent or before harming herself while at [facility]. A: pt provided support and encouragement. Pt given medication per protocol and standing orders. Q60m safety checks implemented and continued.  R: pt safe on the unit. Will continue to monitor.  Pt progressing in the following metrics  Problem: Education: Goal: Emotional status will improve Outcome: Progressing Goal: Mental status will improve Outcome: Progressing   Problem: Activity: Goal: Interest or engagement in activities will improve Outcome: Progressing Goal: Sleeping patterns will improve Outcome: Progressing   Problem: Coping: Goal: Ability to demonstrate self-control will improve Outcome: Progressing   Problem: Health Behavior/Discharge Planning: Goal: Compliance with treatment plan for underlying cause of condition will improve Outcome: Progressing   Problem: Physical Regulation: Goal: Ability to maintain clinical measurements within normal limits will improve Outcome: Progressing

## 2017-12-28 NOTE — Tx Team (Signed)
Interdisciplinary Treatment and Diagnostic Plan Update  12/28/2017 Time of Session:  Tanya Kelley MRN: [id number]  Principal Diagnosis: <principal problem not specified>  Secondary Diagnoses: Active Problems:   Bipolar disorder current episode depressed (HCC)   Current Medications:  Current Facility-Administered Medications  Medication Dose Route Frequency Provider Last Rate Last Dose  . escitalopram (LEXAPRO) tablet 10 mg  10 mg Oral Daily Cobos, Myer Peer, MD   10 mg at 12/28/17 0806  . feeding supplement (ENSURE ENLIVE) (ENSURE ENLIVE) liquid 237 mL  237 mL Oral BID BM Cobos, Fernando A, MD      . Influenza vac split quadrivalent PF (FLUARIX) injection 0.5 mL  0.5 mL Intramuscular Tomorrow-1000 Cobos, Fernando A, MD      . LORazepam (ATIVAN) tablet 0.5 mg  0.5 mg Oral Q6H PRN Cobos, Myer Peer, MD      . nicotine (NICODERM CQ - dosed in mg/24 hours) patch 21 mg  21 mg Transdermal Daily Cobos, Fernando A, MD      . QUEtiapine (SEROQUEL) tablet 50 mg  50 mg Oral QHS Cobos, Myer Peer, MD   50 mg at 12/27/17 2203   PTA Medications: Medications Prior to Admission  Medication Sig Dispense Refill Last Dose  . docusate sodium (COLACE) 100 MG capsule Take 1 capsule (100 mg total) by mouth 2 (two) times daily. While taking narcotic pain medicine. (Patient not taking: Reported on 05/08/2017) 30 capsule 0 Unknown at Unknown time  . ibuprofen (ADVIL,MOTRIN) 600 MG tablet Take 1 tablet (600 mg total) by mouth every 6 (six) hours as needed. (Patient not taking: Reported on 05/08/2017) 30 tablet 0 Unknown at Unknown time  . oxyCODONE (ROXICODONE) 5 MG immediate release tablet Take 1 tablet (5 mg total) by mouth every 4 (four) hours as needed for moderate pain or severe pain. For no more than 4 days. (Patient not taking: Reported on 05/08/2017) 20 tablet 0 Unknown at Unknown time  . senna (SENOKOT) 8.6 MG TABS tablet Take 2 tablets (17.2 mg total) by mouth 2 (two) times daily. (Patient not taking:  Reported on 05/08/2017) 30 each 0 Unknown at Unknown time  . valACYclovir (VALTREX) 1000 MG tablet Take 2 tabs with onset of symptoms and then 2 tabs in 12 hours PRN for cold sore (Patient not taking: Reported on 05/08/2017) 20 tablet 3 Unknown at Unknown time    Patient Stressors: Educational concerns Financial difficulties Health problems  Patient Strengths: Ability for insight Active sense of humor Average or above average intelligence  Treatment Modalities: Medication Management, Group therapy, Case management,  1 to 1 session with clinician, Psychoeducation, Recreational therapy.   Physician Treatment Plan for Primary Diagnosis: <principal problem not specified> Long Term Goal(s): Improvement in symptoms so as ready for discharge Improvement in symptoms so as ready for discharge   Short Term Goals: Ability to identify changes in lifestyle to reduce recurrence of condition will improve Ability to maintain clinical measurements within normal limits will improve Ability to identify changes in lifestyle to reduce recurrence of condition will improve Ability to maintain clinical measurements within normal limits will improve  Medication Management: Evaluate patient's response, side effects, and tolerance of medication regimen.  Therapeutic Interventions: 1 to 1 sessions, Unit Group sessions and Medication administration.  Evaluation of Outcomes: Not Met  Physician Treatment Plan for Secondary Diagnosis: Active Problems:   Bipolar disorder current episode depressed ([place])  Long Term Goal(s): Improvement in symptoms so as ready for discharge Improvement in symptoms so as ready for discharge  Short Term Goals: Ability to identify changes in lifestyle to reduce recurrence of condition will improve Ability to maintain clinical measurements within normal limits will improve Ability to identify changes in lifestyle to reduce recurrence of condition will improve Ability to maintain  clinical measurements within normal limits will improve     Medication Management: Evaluate patient's response, side effects, and tolerance of medication regimen.  Therapeutic Interventions: 1 to 1 sessions, Unit Group sessions and Medication administration.  Evaluation of Outcomes: Not Met   RN Treatment Plan for Primary Diagnosis: <principal problem not specified> Long Term Goal(s): Knowledge of disease and therapeutic regimen to maintain health will improve  Short Term Goals: Ability to participate in decision making will improve, Ability to verbalize feelings will improve, Ability to disclose and discuss suicidal ideas and Ability to identify and develop effective coping behaviors will improve  Medication Management: RN will administer medications as ordered by provider, will assess and evaluate patient's response and provide education to patient for prescribed medication. RN will report any adverse and/or side effects to prescribing provider.  Therapeutic Interventions: 1 on 1 counseling sessions, Psychoeducation, Medication administration, Evaluate responses to treatment, Monitor vital signs and CBGs as ordered, Perform/monitor CIWA, COWS, AIMS and Fall Risk screenings as ordered, Perform wound care treatments as ordered.  Evaluation of Outcomes: Not Met   LCSW Treatment Plan for Primary Diagnosis: <principal problem not specified> Long Term Goal(s): Safe transition to appropriate next level of care at discharge, Engage patient in therapeutic group addressing interpersonal concerns.  Short Term Goals: Engage patient in aftercare planning with referrals and resources  Therapeutic Interventions: Assess for all discharge needs, 1 to 1 time with Social worker, Explore available resources and support systems, Assess for adequacy in community support network, Educate family and significant other(s) on suicide prevention, Complete Psychosocial Assessment, Interpersonal group  therapy.  Evaluation of Outcomes: Not Met   Progress in Treatment: Attending groups: Yes. Participating in groups: Yes. Taking medication as prescribed: Yes. Toleration medication: Yes. Family/Significant other contact made: No, will contact:  the patient's daughter Patient understands diagnosis: Yes. Discussing patient identified problems/goals with staff: Yes. Medical problems stabilized or resolved: Yes. Denies suicidal/homicidal ideation: Yes. Issues/concerns per patient self-inventory: No. Other:  New problem(s) identified: None   New Short Term/Long Term Goal(s): medication stabilization, elimination of SI thoughts, development of comprehensive mental wellness plan.    Patient Goals:   Help with MDD, SI and PTSD  Discharge Plan or Barriers: Patient lives in [place] and reports interest in medication management services. CSW will assess for appropriate referrals and possible discharge planning.   Reason for Continuation of Hospitalization: Anxiety Depression Medication stabilization Suicidal ideation  Estimated Length of Stay: 3-5 days   Attendees: Patient: 12/28/2017 8:51 AM  Physician: Dr. Neita Garnet, MD 12/28/2017 8:51 AM  Nursing: Legrand Como.Chauncey Cruel, RN 12/28/2017 8:51 AM  RN Care Manager: Lars Pinks, RN 12/28/2017 8:51 AM  Social Worker: Theresa Duty 12/28/2017 8:51 AM  Recreational Therapist: Rhunette Croft 12/28/2017 8:51 AM  Other: Agustina Caroli, NP 12/28/2017 8:51 AM  Other:  12/28/2017 8:51 AM  Other: 12/28/2017 8:51 AM    Scribe for Treatment Team: Marylee Floras, [place] 12/28/2017 8:51 AM

## 2017-12-28 NOTE — [facility] Group Notes (Signed)
[facility] Group Notes:  (Nursing/MHT/Case Management/Adjunct)  Date:  12/28/2017  Time:  4:00 PM  Type of Therapy:  Nurse Education  Participation Level:  Active  Participation Quality:  Appropriate and Attentive  Affect:  Appropriate  Cognitive:  Alert and Appropriate  Insight:  Appropriate  Engagement in Group:  Engaged  Modes of Intervention:  Discussion and Education  Summary of Progress/Problems: The purpose of this group is to educate about the physiologic effects of anxiety on the body and the benefits of mindfulness to decrease anxiety. Patients were provided techniques to practice grounding and mindfulness.  Otelia Limes Amberlea Spagnuolo 12/28/2017, 6:01 PM

## 2017-12-28 NOTE — [facility] Group Notes (Signed)
Adult Psychoeducational Group Note  Date:  12/28/2017 Time:  9:14 PM  Group Topic/Focus:  Wrap-Up Group:   The focus of this group is to help patients review their daily goal of treatment and discuss progress on daily workbooks.  Participation Level:  Active  Participation Quality:  Appropriate and Attentive  Affect:  Appropriate  Cognitive:  Alert and Appropriate  Insight: Appropriate and Good  Engagement in Group:  Engaged  Modes of Intervention:  Discussion and Education  Additional Comments:  Pt attended and participated in wrap up group this evening. Pt rated their day a 7/10, due to them feeling better today than they did yesterday. Pt told Probation officer that they got out of bed and they saw their daughter. Pt completed their goal, which was to go to groups.   Cristi Loron 12/28/2017, 9:14 PM

## 2017-12-28 NOTE — Progress Notes (Signed)
D: Patient is alert and cooperative. Patient presents with flat, sad facial expression and sad affect. Patient denies SI, HI, AVH, and verbally contracts for safety. Patient reports drowsiness, sadness, "feeling down", and "feeling trapped in here". Patient presents with preoccupied content discussing "8 hour youtube hypnosis video" "all down hill since then, I haven't been the same since". Patient rates depression 6/10 and anxiety 5/10. Patient denies physical symptoms/pain.   A: Scheduled medications administered per MD order. Support provided. Patient educated on safety on the unit and medications. Routine safety checks every 15 minutes. Patient stated understanding to tell nurse about any new physical symptoms. Patient understands to tell staff of any needs.     R: No adverse drug reactions noted. Patient verbally contracts for safety. Patient remains safe at this time and will continue to monitor.

## 2017-12-28 NOTE — Progress Notes (Signed)
Recreation Therapy Notes  Date: 11.4.19 Time: 0930 Location: [address] Dayroom  Group Topic: Stress Management  Goal Area(s) Addresses:  Patient will verbalize importance of using healthy stress management.  Patient will identify positive emotions associated with healthy stress management.   Intervention: Stress Management  Activity :  Meditation.  LRT introduced the stress management technique of meditation.  LRT played a meditation dealing with resilience.  Patients were to listen and follow along with the meditation.  Education:  Stress Management, Discharge Planning.   Education Outcome: Acknowledges edcuation/In group clarification offered/Needs additional education  Clinical Observations/Feedback: Pt did not attend group.     Victorino Sparrow, LRT/CTRS         Ria Comment, Maelyn Berrey A 12/28/2017 11:05 AM

## 2017-12-29 DIAGNOSIS — F315 Bipolar disorder, current episode depressed, severe, with psychotic features: Secondary | ICD-10-CM

## 2017-12-29 LAB — URINE CULTURE: SPECIAL REQUESTS: NORMAL

## 2017-12-29 LAB — TSH: TSH: 1.491 u[IU]/mL (ref 0.350–4.500)

## 2017-12-29 MED ORDER — PANTOPRAZOLE SODIUM 40 MG PO TBEC
40.0000 mg | DELAYED_RELEASE_TABLET | Freq: Every day | ORAL | Status: DC
  Administered 2017-12-29 – 2018-01-01 (×4): 40 mg via ORAL
  Filled 2017-12-29 (×6): qty 1

## 2017-12-29 MED ORDER — ESCITALOPRAM OXALATE 20 MG PO TABS
20.0000 mg | ORAL_TABLET | Freq: Every day | ORAL | Status: DC
  Administered 2017-12-30 – 2018-01-01 (×3): 20 mg via ORAL
  Filled 2017-12-29 (×5): qty 1

## 2017-12-29 MED ORDER — ESCITALOPRAM OXALATE 10 MG PO TABS
10.0000 mg | ORAL_TABLET | Freq: Once | ORAL | Status: AC
  Administered 2017-12-29: 10 mg via ORAL
  Filled 2017-12-29: qty 1

## 2017-12-29 MED ORDER — MAGNESIUM HYDROXIDE 400 MG/5ML PO SUSP
30.0000 mL | Freq: Every day | ORAL | Status: AC | PRN
  Administered 2017-12-29: 30 mL via ORAL

## 2017-12-29 MED ORDER — ALUM & MAG HYDROXIDE-SIMETH 200-200-20 MG/5ML PO SUSP
30.0000 mL | ORAL | Status: DC | PRN
  Administered 2017-12-30: 30 mL via ORAL
  Filled 2017-12-29: qty 30

## 2017-12-29 MED ORDER — QUETIAPINE FUMARATE 100 MG PO TABS
100.0000 mg | ORAL_TABLET | Freq: Every day | ORAL | Status: DC
  Administered 2017-12-29: 100 mg via ORAL
  Filled 2017-12-29 (×2): qty 1

## 2017-12-29 NOTE — [facility] Group Notes (Signed)
[facility] Group Notes:  (Nursing/MHT/Case Management/Adjunct)  Date:  12/29/2017  Time:  3:30 pm  Type of Therapy:  Psychoeducational Skills, therapeutic group  Participation Level:  Active  Participation Quality:  Appropriate  Affect:  Appropriate  Cognitive:  Appropriate  Insight:  Appropriate  Engagement in Group:  Engaged  Modes of Intervention:  Discussion and Education  Summary of Progress/Problems:  Patient was alert and participated in group.   Cammy Copa 12/29/2017, 6:14 PM

## 2017-12-29 NOTE — Progress Notes (Signed)
Recreation Therapy Notes  Animal-Assisted Activity (AAA) Program Checklist/Progress Notes Patient Eligibility Criteria Checklist & Daily Group note for Rec Tx Intervention  Date: 11.5.19 Time: 1430 Location: [address] Dayroom   AAA/T Program Assumption of Risk Form signed by Patient/ or Parent Legal Guardian YES   Patient is free of allergies or sever asthma  YES   Patient reports no fear of animals  YES   Patient reports no history of cruelty to animals YES   Patient understands his/her participation is voluntary YES   Patient washes hands before animal contact  YES   Patient washes hands after animal contact  YES NO:[id number]}  Behavioral Response: Engaged  Education: Hand Washing, Appropriate Animal Interaction   Education Outcome: Acknowledges understanding/In group clarification offered/Needs additional education.   Clinical Observations/Feedback: Pt attended and participated in activity.    Lanyla Costello, LRT/CTRS         Imogean Ciampa A 12/29/2017 3:52 PM 
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
## 2017-12-29 NOTE — BHH Group Notes (Signed)
Adult Psychoeducational Group Note  Date:  12/29/2017 Time:  9:40 PM  Group Topic/Focus:  Wrap-Up Group:   The focus of this group is to help patients review their daily goal of treatment and discuss progress on daily workbooks.  Participation Level:  Active  Participation Quality:  Appropriate and Attentive  Affect:  Appropriate  Cognitive:  Alert and Appropriate  Insight: Appropriate and Good  Engagement in Group:  Engaged  Modes of Intervention:  Discussion and Education  Additional Comments:  Pt attended and participated in wrap up group this evening. Pt rated their day a 7/10, due to them interacting with the pt on their hall, and working in the workbook. Pt expressed to writer that they are nervous about their meds being changed. Pt completed their goal, which was to go to groups.   Cristi Loron 12/29/2017, 9:40 PM

## 2017-12-29 NOTE — BHH Group Notes (Signed)
LCSW Group Therapy Note 12/29/2017 8:16 AM  Type of Therapy and Topic: Group Therapy: Overcoming Obstacles  Participation Level: Active  Description of Group:  In this group patients will be encouraged to explore what they see as obstacles to their own wellness and recovery. They will be guided to discuss their thoughts, feelings, and behaviors related to these obstacles. The group will process together ways to cope with barriers, with attention given to specific choices patients can make. Each patient will be challenged to identify changes they are motivated to make in order to overcome their obstacles. This group will be process-oriented, with patients participating in exploration of their own experiences as well as giving and receiving support and challenge from other group members.  Therapeutic Goals: 1. Patient will identify personal and current obstacles as they relate to admission. 2. Patient will identify barriers that currently interfere with their wellness or overcoming obstacles.  3. Patient will identify feelings, thought process and behaviors related to these barriers. 4. Patient will identify two changes they are willing to make to overcome these obstacles:   Summary of Patient Progress  Tanya Kelley was engaged and participated throughout the group session. Tanya Kelley states that her obstacle is "my negative relationship with my husband". Tanya Kelley shares that while in the hospital, she has decided to get a divorce to help remove herself from their toxic relationship.      Therapeutic Modalities:  Cognitive Behavioral Therapy Solution Focused Therapy Motivational Interviewing Relapse Prevention Therapy   Theresa Duty Clinical Social Worker

## 2017-12-29 NOTE — Progress Notes (Signed)
D:  Patient's self inventory sheet, patient sleeps good, no sleep medication.  Good appetite, low energy level, good concentration.  Rated depression and anxiety #2.  Denied withdrawals.  Denied SI.  Denied physical problems.  Denied physical pain.  Goal is attend groups, positive thoughts.  No discharge plans. A:  Medications administered per MD orders.  Emotional support and encouragement given patient. R:  Denied SI and HI, contracts for safety.  Denied A/V hallucinations.  Safety maintained with 15 minute checks.

## 2017-12-29 NOTE — Progress Notes (Signed)
D: Patient is alert, oriented, pleasant, and cooperative. Patient denies SI, HI, AVH, and verbally contracts for safety. Patient reports she slept well, ate all three meals, and went to groups. Patient denies physical symptoms/pain.   A: Scheduled medications administered per MD order. Support provided. Patient educated on safety on the unit and medications. Routine safety checks every 15 minutes. Patient stated understanding to tell nurse about any new physical symptoms. Patient understands to tell staff of any needs.     R: No adverse drug reactions noted. Patient verbally contracts for safety. Patient remains safe at this time and will continue to monitor.

## 2017-12-29 NOTE — BHH Suicide Risk Assessment (Signed)
[place] INPATIENT:  Family/Significant Other Suicide Prevention Education  Suicide Prevention Education:  Contact Attempts: Leeanne Deed, daughter [phone]) has been identified by the patient as the family member/significant other with whom the patient will be residing, and identified as the person(s) who will aid the patient in the event of a mental health crisis.  With written consent from the patient, two attempts were made to provide suicide prevention education, prior to and/or following the patient's discharge.  We were unsuccessful in providing suicide prevention education.  A suicide education pamphlet was given to the patient to share with family/significant other.  Date and time of first attempt:12/29/2017 / 2:13pm   Tanya Kelley 12/29/2017, 2:13 PM

## 2017-12-29 NOTE — Plan of Care (Signed)
Nurse discussed anxiety, depression, coping skills with patient. 

## 2017-12-30 MED ORDER — ZIPRASIDONE HCL 20 MG PO CAPS
20.0000 mg | ORAL_CAPSULE | Freq: Every day | ORAL | Status: DC
  Administered 2017-12-31 – 2018-01-01 (×2): 20 mg via ORAL
  Filled 2017-12-30 (×5): qty 1

## 2017-12-30 MED ORDER — ZIPRASIDONE HCL 40 MG PO CAPS
40.0000 mg | ORAL_CAPSULE | Freq: Every day | ORAL | Status: DC
  Administered 2017-12-30 – 2017-12-31 (×2): 40 mg via ORAL
  Filled 2017-12-30: qty 1
  Filled 2017-12-30: qty 2
  Filled 2017-12-30 (×3): qty 1

## 2017-12-30 MED ORDER — TRAZODONE HCL 50 MG PO TABS: 25.0000 mg | ORAL_TABLET | Freq: Every evening | ORAL | Status: DC | PRN

## 2017-12-30 NOTE — Progress Notes (Signed)
DAR NOTE: Patient presents with calm affect and pleasant  Mood. Pt has been observed interacting well with peers on the unit. Pt staed she is feeling better today, complained of being a bit groggy this morning. Reports good sleep, good appetite, normal energy, and good concentration. Denies pain, auditory and visual hallucinations.  Rates depression at 2, hopelessness at 2, and anxiety at 2.  Maintained on routine safety checks.  Medications given as prescribed.  Support and encouragement offered as needed.  Attended group and participated.  States goal for today is "Positive attitude." Patient observed socializing with peers in the dayroom.  Offered no complaint.

## 2017-12-30 NOTE — BHH Suicide Risk Assessment (Signed)
[place] INPATIENT:  Family/Significant Other Suicide Prevention Education  Suicide Prevention Education:  Contact Attempts: Leeanne Deed, daughter [phone]) has been identified by the patient as the family member/significant other with whom the patient will be residing, and identified as the person(s) who will aid the patient in the event of a mental health crisis.  With written consent from the patient, two attempts were made to provide suicide prevention education, prior to and/or following the patient's discharge.  We were unsuccessful in providing suicide prevention education.  A suicide education pamphlet was given to the patient to share with family/significant other.   Date and time of second attempt: 12/30/17 /12:59pm  Marylee Floras 12/30/2017, 12:59 PM

## 2017-12-30 NOTE — Therapy (Signed)
Occupational Therapy Group Note  Date:  12/30/2017 Time:  4:40 PM  Group Topic/Focus:  Self Esteem Action Plan:   The focus of this group is to help patients create a plan to continue to build self-esteem after discharge.  Participation Level:  Active  Participation Quality:  Appropriate  Affect:  Blunted  Cognitive:  Appropriate  Insight: Improving  Engagement in Group:  Engaged  Modes of Intervention:  Activity, Discussion, Education and Socialization  Additional Comments:    S: "My self esteem is pretty low"   O: OT tx with focus on self esteem building this date. Education given on definition of self esteem, with both causes of low and high self esteem identified. Activity given for pt to identify a positive/aspiring trait for each letter of the alphabet. Pt to work with peers to help complete activity and build positive thinking.    A: Pt presents to group with blunted affect, engaged and participatory throughout session. Pt contribued to self esteem discussion stating that job stress can decrease her sense of self esteem. A-z activity completed with min-mod VC's. Pt appropriately brainstorming with other peers for support. Noted significant improved affect after completion of activity.    P: Education given on self esteem and how to improve this date. Handouts and activities given to help facilitate skills when reintegrating into community.   Zenovia Jarred, MSOT, OTR/L Behavioral Health OT/ Acute Relief OT  Zenovia Jarred 12/30/2017, 4:40 PM

## 2017-12-30 NOTE — Progress Notes (Signed)
Pt reports her day was ok, but could have been better.  She denies SI/HI/AVH at this time.  She had questions about her medications which Probation officer answered.  Pt has been in the dayroom most of the evening, talking with peers and watching TV.  Pt has been pleasant and cooperative.  Support and encouragement offered.  Discharge plans are in process.  Safety maintained with q15 minute checks.

## 2017-12-30 NOTE — Progress Notes (Signed)
[facility] MD Progress Note  12/30/2017 10:35 AM Tanya Kelley  MRN:  [id number] Subjective:  47 year old married female, presented following suicide attempt by overdosing on flexeril and alcohol. Reports worsening depression, anxiety, neuro-vegetative symptoms of depression. States she has experienced a number of " recovered " memories from traumatic childhood and adulthood events particularly after participating in an internet based hypnosis program. States that in spite of assurances from family that these events did not occur, she ruminates about them, feels guilty, and worries she may be ostracized by people or arrested for things she allegedly did in the past .  Objective: Patient is seen and examined.  Patient is a 47 year old female admitted following a suicide attempt by overdosing on Flexeril and alcohol.  She is seen in follow-up.  We attempted to increase her Seroquel yesterday secondary to intrusive thoughts and "memories".  Unfortunately this morning she feels groggy and oversedated.  We discussed possible medication changes.  She does feel as though the Lexapro is helping her and feels much less anxious than she had been.  She had lost a significant amount of weight prior to admission, and I checked her TSH yesterday.  It was 1.491 within normal limits.  This morning her major issue really has to do with grogginess.  She stated some of the intrusive thoughts have decreased, and she reports improved mood, and improved anxiety control.  Principal Problem: <principal problem not specified> Diagnosis:   Patient Active Problem List   Diagnosis Date Noted  . Bipolar disorder current episode depressed ([place]) [F31.30] 12/27/2017  . Adjustment disorder with depressed mood [F43.21] 05/09/2017  . Delusional disorder ([place]) [F22] 05/09/2017  . Weight loss, unintentional [R63.4] 01/05/2017  . Breast mass [N63.0] 01/05/2017  . Right knee pain [M25.561] 11/19/2015  . Effusion of bursa of right knee  [M25.461] 11/19/2015  . Lymphadenitis [I88.9] 05/18/2015  . 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 Hyperlipidemia LDL goal <100 [E78.5] 04/09/2015  . Vitamin D deficiency [E55.9] 04/09/2015  . Tobacco use disorder [F17.200] 04/06/2015  . Encounter to establish care [Z76.89] 04/06/2015  . Encounter for preventive health examination [Z00.00] 04/06/2015  . 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 Chest pain [R07.9]   . Hot flashes [R23.2]    Total Time spent with patient: 15 minutes  Past Psychiatric History: The admission H&P  Past Medical History:  Past Medical History:  Diagnosis Date  . ABDOMINAL BLOATING 04/10/2009   Qualifier: Diagnosis of  By: Nelson-Smith CMA (AAMA), Dottie    . Abdominal pain, unspecified site 04/10/2009   Qualifier: Diagnosis of  By: Nelson-Smith CMA (AAMA), Dottie    . ANEMIA, IRON DEFICIENCY 04/12/2009   Qualifier: Diagnosis of  By: Surface RN, Butch Penny    . B12 DEFICIENCY 04/12/2009   Qualifier: Diagnosis of  By: Surface RN, Butch Penny    . Barrett esophagus 2013  . CERVICAL RADICULOPATHY, RIGHT 06/26/2009   Qualifier: Diagnosis of  By: Lorelei Pont MD, Frederico Hamman    . Chest pain   . Colon polyps   . CONSTIPATION 04/10/2009   Qualifier: Diagnosis of  By: Nelson-Smith CMA (AAMA), Dottie    . COPD 04/10/2009   no per pt 05-25-15  . Diarrhea 04/10/2009   Qualifier: Diagnosis of  By: Nelson-Smith CMA (AAMA), Dottie    . Fatigue   . GERD (gastroesophageal reflux disease)   . Hot flashes   . Hyperlipidemia   . Jaw pain   . MELENA 04/10/2009   Qualifier: Diagnosis of  By: Harlon Ditty CMA (  AAMA), Dottie    . Nausea   . PONV (postoperative nausea and vomiting)   . SACROILIITIS, RIGHT 06/26/2009   Qualifier: Diagnosis of  By: Lorelei Pont MD, Frederico Hamman    . Shoulder pain   . WEIGHT LOSS 04/10/2009   Qualifier: Diagnosis of  By: Harlon Ditty CMA (AAMA), Dottie      Past Surgical History:  Procedure Laterality Date  . CHOLECYSTECTOMY    . COLONOSCOPY    . EXCISION MORTON'S NEUROMA  Bilateral 01/29/2017   Procedure: Morton's Neuroma Excision [employer];  Surgeon: Wylene Simmer, MD;  Location: [place];  Service: Orthopedics;  Laterality: Bilateral;  . FOOT SURGERY Bilateral   . TUBAL LIGATION    . UPPER GASTROINTESTINAL ENDOSCOPY  06/12/2015  . WEIL OSTEOTOMY Bilateral 01/29/2017   Procedure: Bilateral Second Metatarsal Weil Osteotomy;  Surgeon: Wylene Simmer, MD;  Location: [place];  Service: Orthopedics;  Laterality: Bilateral;   Family History:  Family History  Problem Relation Age of Onset  . Hypertension Father   . Clotting disorder Father   . Colon polyps Father   . Healthy Mother   . Healthy Brother   . Heart attack Paternal Grandfather   . Heart disease Paternal Grandfather   . Early death Paternal Grandfather 61       MI  . Colon cancer Paternal Grandmother 52       alive  . Clotting disorder Paternal Grandmother   . Colon polyps Paternal Aunt        x 2  . Esophageal cancer Neg Hx   . Rectal cancer Neg Hx   . Stomach cancer Neg Hx    Family Psychiatric  History: See admission H&P Social History:  Social History   Substance and Sexual Activity  Alcohol Use Yes   Comment: occassional      Social History   Substance and Sexual Activity  Drug Use No    Social History   Socioeconomic History  . Marital status: Married    Spouse name: Not on file  . Number of children: 3  . Years of education: Not on file  . Highest education level: Not on file  Occupational History  . Not on file  Social Needs  . Financial resource strain: Not hard at all  . Food insecurity:    Worry: Never true    Inability: Never true  . Transportation needs:    Medical: No    Non-medical: No  Tobacco Use  . Smoking status: Current Some Day Smoker    Packs/day: 0.00    Years: 27.00    Pack years: 0.00    Types: Cigarettes  . Smokeless tobacco: Never Used  . Tobacco comment: only smokes occasionally, with drinks   Substance and Sexual Activity  . Alcohol use: Yes    Comment: occassional   . Drug use: No  . Sexual activity: Yes    Birth control/protection: Surgical    Comment: Tubal 2000  Lifestyle  . Physical activity:    Days per week: 0 days    Minutes per session: 0 min  . Stress: Very much  Relationships  . Social connections:    Talks on phone: Twice a week    Gets together: Twice a week    Attends religious service: 1 to 4 times per year    Active member of club or organization: No    Attends meetings of clubs or organizations: Never    Relationship status: Married  Other  Topics Concern  . Not on file  Social History Narrative   Married, Lorin Mercy. 3 children Bobette Mo, [place], [place]).    Some college. 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    Drinks caffeine rarely,  Has recently switched to decaf.   Wears seatbelt, smoke detector in the home.    Firearms in a locked case.    Feels safe in relationships.    Additional Social History:    Pain Medications: Please see MAR Prescriptions: Please see MAR Over the Counter: Please see MAR History of alcohol / drug use?: No history of alcohol / drug abuse Longest period of sobriety (when/how long): Pt denies Negative Consequences of Use: Legal, Personal relationships Withdrawal Symptoms: Change in blood pressure                    Sleep: Good  Appetite:  Fair  Current Medications: Current Facility-Administered Medications  Medication Dose Route Frequency Provider Last Rate Last Dose  . alum & mag hydroxide-simeth (MAALOX/MYLANTA) 200-200-20 MG/5ML suspension 30 mL  30 mL Oral Q4H PRN Lindon Romp A, NP      . escitalopram (LEXAPRO) tablet 20 mg  20 mg Oral Daily Sharma Covert, MD   20 mg at 12/30/17 0800  . feeding supplement (ENSURE ENLIVE) (ENSURE ENLIVE) liquid 237 mL  237 mL Oral BID BM Cobos, Myer Peer, MD   237 mL at 12/30/17 0802  . guaiFENesin (MUCINEX) 12 hr tablet 600 mg  600 mg Oral BID  Lindell Spar I, NP   600 mg at 12/30/17 0800  . LORazepam (ATIVAN) tablet 0.5 mg  0.5 mg Oral Q6H PRN Cobos, Myer Peer, MD      . nicotine (NICODERM CQ - dosed in mg/24 hours) patch 21 mg  21 mg Transdermal Daily Cobos, Myer Peer, MD   21 mg at 12/30/17 0802  . pantoprazole (PROTONIX) EC tablet 40 mg  40 mg Oral Daily Sharma Covert, MD   40 mg at 12/30/17 0800  . traZODone (DESYREL) tablet 25 mg  25 mg Oral QHS PRN Sharma Covert, MD      . Derrill Memo ON 12/31/2017] ziprasidone (GEODON) capsule 20 mg  20 mg Oral Q breakfast Sharma Covert, MD      . ziprasidone (GEODON) capsule 40 mg  40 mg Oral Q supper Sharma Covert, MD        Lab Results:  Results for orders placed or performed during the hospital encounter of 12/27/17 (from the past 48 hour(s))  TSH     Status: None   Collection Time: 12/29/17  6:39 PM  Result Value Ref Range   TSH 1.491 0.350 - 4.500 uIU/mL    Comment: Performed by a 3rd Generation assay with a functional sensitivity of <=0.01 uIU/mL. Performed at [facility], [place] [address] [place], [place] [address]     Blood Alcohol level:  Lab Results  Component Value Date   ETH 145 (H) 12/26/2017   ETH <10 86/57/8469    Metabolic Disorder Labs: Lab Results  Component Value Date   HGBA1C 5.1 12/28/2017   MPG 99.67 12/28/2017   No results found for: PROLACTIN Lab Results  Component Value Date   CHOL 227 (H) 12/28/2017   TRIG 105 12/28/2017   HDL 40 (L) 12/28/2017   CHOLHDL 5.7 12/28/2017   VLDL 21 12/28/2017   LDLCALC 166 (H) 12/28/2017   LDLCALC 190 (H) 04/06/2015    Physical Findings: AIMS: Facial  and Oral Movements Muscles of Facial Expression: None, normal Lips and Perioral Area: None, normal Jaw: None, normal Tongue: None, normal,Extremity Movements Upper (arms, wrists, hands, fingers): None, normal Lower (legs, knees, ankles, toes): None, normal, Trunk Movements Neck, shoulders, hips: None, normal, Overall  Severity Severity of abnormal movements (highest score from questions above): None, normal Incapacitation due to abnormal movements: None, normal Patient's awareness of abnormal movements (rate only patient's report): No Awareness, Dental Status Current problems with teeth and/or dentures?: No Does patient usually wear dentures?: No  CIWA:  CIWA-Ar Total: 1 COWS:  COWS Total Score: 2  Musculoskeletal: Strength & Muscle Tone: within normal limits Gait & Station: normal Patient leans: N/A  Psychiatric Specialty Exam: Physical Exam  Nursing note and vitals reviewed. Constitutional: She is oriented to person, place, and time. She appears well-developed and well-nourished.  HENT:  Head: Atraumatic.  Respiratory: Effort normal.  Neurological: She is alert and oriented to person, place, and time.    ROS  Blood pressure (!) 81/64, pulse 98, temperature 98.6 F (37 C), temperature source Oral, resp. rate 16, height 5\' 8"  (1.727 m), weight 64.9 kg, last menstrual period 05/18/2015, SpO2 98 %.Body mass index is 21.74 kg/m.  General Appearance: Casual  Eye Contact:  Fair  Speech:  Normal Rate  Volume:  Decreased  Mood:  Sedated  Affect:  Congruent  Thought Process:  Coherent and Descriptions of Associations: Intact  Orientation:  Full (Time, Place, and Person)  Thought Content:  Obsessions and Rumination  Suicidal Thoughts:  Yes.  without intent/plan  Homicidal Thoughts:  No  Memory:  Immediate;   Fair Recent;   Fair Remote;   Fair  Judgement:  Intact  Insight:  Fair  Psychomotor Activity:  Decreased and Psychomotor Retardation  Concentration:  Concentration: Fair and Attention Span: Fair  Recall:  [employer] of Knowledge:  Fair  Language:  Fair  Akathisia:  Negative  Handed:  Right  AIMS (if indicated):     Assets:  Communication Skills Desire for Improvement Financial Resources/Insurance Housing Leisure Time Physical Health Resilience Social Support  ADL's:  Intact   Cognition:  WNL  Sleep:  Number of Hours: 6     Treatment Plan Summary: Daily contact with patient to assess and evaluate symptoms and progress in treatment, Medication management and Plan : 47 year old married female, presented following suicide attempt by overdosing on flexeril and alcohol. Reports worsening depression, anxiety, neuro-vegetative symptoms of depression. States she has experienced a number of " recovered " memories from traumatic childhood and adulthood events particularly after participating in an internet based hypnosis program. States that in spite of assurances from family that these events did not occur, she ruminates about them, feels guilty, and worries she may be ostracized by people or arrested for things she allegedly did in the past .    Patient appears to be improving, but still having obsessions and ruminations about perceived memories.  Today she is more groggy than she was yesterday, and we will have to stop the Seroquel. #1-anxiety control-continue Ativan 0.5 mg p.o. every 6 hours as needed anxiety #2-mood stability-stop Seroquel, start Geodon 20 mg p.o. every morning and 40 mg p.o. every afternoon for mood stability as well as psychosis. #3-anxiety and depression-continue Lexapro 20 mg p.o. Daily. #4-weight loss-TSH normal #5-disposition planning-in progress.  Sharma Covert, MD 12/30/2017, 10:35 AM

## 2017-12-30 NOTE — BHH Suicide Risk Assessment (Signed)
[facility] INPATIENT:  Family/Significant Other Suicide Prevention Education  Suicide Prevention Education:  Education Completed; Leeanne Deed, daughter [phone]) has been identified by the patient as the family member/significant other with whom the patient will be residing, and identified as the person(s) who will aid the patient in the event of a mental health crisis (suicidal ideations/suicide attempt).  With written consent from the patient, the family member/significant other has been provided the following suicide prevention education, prior to the and/or following the discharge of the patient.  The suicide prevention education provided includes the following:  Suicide risk factors  Suicide prevention and interventions  National Suicide Hotline telephone number  [facility] assessment telephone number  [facility] Emergency Assistance [place] and/or Residential Mobile Crisis Unit telephone number  Request made of family/significant other to:  Remove weapons (e.g., guns, rifles, knives), all items previously/currently identified as safety concern.    Remove drugs/medications (over-the-counter, prescriptions, illicit drugs), all items previously/currently identified as a safety concern.  The family member/significant other verbalizes understanding of the suicide prevention education information provided.  The family member/significant other agrees to remove the items of safety concern listed above.  Patient's daughter is concerned that the patient may not continue to be compliant with medications at discharge.   Marylee Floras 12/30/2017, 1:10 PM

## 2017-12-30 NOTE — Progress Notes (Signed)
Adult Psychoeducational Group Note  Date:  12/30/2017 Time:  10:29 AM  Group Topic/Focus:  Goals Group:   The focus of this group is to help patients establish daily goals to achieve during treatment and discuss how the patient can incorporate goal setting into their daily lives to aide in recovery.  Participation Level:  Active  Participation Quality:  Appropriate  Affect:  Appropriate  Cognitive:  Appropriate  Insight: Good  Engagement in Group:  Engaged  Modes of Intervention:  Discussion  Additional Comments:  Pt attended morning goals group.   Tanya Kelley 12/30/2017, 10:29 AM

## 2017-12-30 NOTE — BHH Group Notes (Signed)
[facility] Mental Health Association Group Therapy      12/30/2017 3:51 PM  Type of Therapy: Mental Health Association Presentation  Participation Level: Active  Participation Quality: Attentive  Affect: Appropriate  Cognitive: Oriented  Insight: Developing/Improving  Engagement in Therapy: Engaged  Modes of Intervention: Discussion, Education and Socialization  Summary of Progress/Problems: [place] ([place]) Speaker came to talk about his personal journey with mental health. The pt processed ways by which to relate to the speaker. [place] speaker provided handouts and educational information pertaining to groups and services offered by the [facility]. Pt was engaged in speaker's presentation and was receptive to resources provided.    [place] Social Worker

## 2017-12-31 NOTE — BHH Group Notes (Signed)
LCSW Group Therapy Note 12/31/2017 8:37 AM  Type of Therapy/Topic: Group Therapy: Feelings about Diagnosis  Participation Level: Active   Description of Group:  This group will allow patients to explore their thoughts and feelings about diagnoses they have received. Patients will be guided to explore their level of understanding and acceptance of these diagnoses. Facilitator will encourage patients to process their thoughts and feelings about the reactions of others to their diagnosis and will guide patients in identifying ways to discuss their diagnosis with significant others in their lives. This group will be process-oriented, with patients participating in exploration of their own experiences, giving and receiving support, and processing challenge from other group members.  Therapeutic Goals: 1. Patient will demonstrate understanding of diagnosis as evidenced by identifying two or more symptoms of the disorder 2. Patient will be able to express two feelings regarding the diagnosis 3. Patient will demonstrate their ability to communicate their needs through discussion and/or role play  Summary of Patient Progress:  Tanya Kelley was engaged and participated throughout the group session. Tanya Kelley states that having a mental health diagnosis is "very scary". She states that having a mental illness is scary because more than likely it will be a "chronic, ongoing issue".      Therapeutic Modalities:  Cognitive Behavioral Therapy Brief Therapy Feelings Identification    [place] Clinical Social Worker

## 2017-12-31 NOTE — Plan of Care (Signed)
  Problem: Education: Goal: Emotional status will improve Outcome: Progressing   Problem: Activity: Goal: Interest or engagement in activities will improve Outcome: Progressing   Problem: Safety: Goal: Periods of time without injury will increase Outcome: Progressing  DAR NOTE: Patient presents with calm affect and pleasant mood.  Denies suicidal thoughts, pain, auditory and visual hallucinations.  Described energy level as normal and concentration as good.  Rates depression at 0, hopelessness at 0, and anxiety at 0.  Maintained on routine safety checks.  Medications given as prescribed.  Support and encouragement offered as needed.  Attended group and participated.  States goal for today is "attend group and stay positive."  Patient observed socializing with peers in the dayroom.  Offered no complaint.

## 2017-12-31 NOTE — Progress Notes (Signed)
Nursing Progress Note: 7p-7a D: Pt currently presents with a pleasant/appropriate affect and behavior. Pt states "I had a really good day. I feel so much better." Interacting appropriately with the milieu. Pt reports good sleep during the previous night with current medication regimen. Pt did attend wrap-up group.  A: Pt provided with medications per providers orders. Pt's labs and vitals were monitored throughout the night. Pt supported emotionally and encouraged to express concerns and questions. Pt educated on medications.  R: Pt's safety ensured with 15 minute and environmental checks. Pt currently denies SI, HI, and AVH. Pt verbally contracts to seek staff if SI,HI, or AVH occurs and to consult with staff before acting on any harmful thoughts. Will continue to monitor.

## 2017-12-31 NOTE — Progress Notes (Signed)
Pt went to bed early tonight.  Writer was able to get her to wake when her name was called.  She reports sedation from the medications that she was started on, and hopes that will get better.  She denies SI/HI/AVH at this time.  She hopes to be discharged home soon.  Pt is pleasant and cooperative.  She makes her needs known to staff.  Support and encouragement offered.  Discharge plans are in process.  Safety maintained with q15 minute checks.

## 2017-12-31 NOTE — BHH Group Notes (Signed)
[facility] Group Notes:  (Nursing/MHT/Case Management/Adjunct)  Date:  12/31/2017  Time:  4:39 PM  Type of Therapy:  Psychoeducational Skills  Participation Level:  Active  Participation Quality:  Appropriate and Attentive  Affect:  Appropriate  Cognitive:  Alert and Appropriate  Insight:  Appropriate and Good  Engagement in Group:  Engaged and Supportive  Modes of Intervention:  Discussion and Education  Summary of Progress/Problems: Discussed crisis management.  Patient was attentive and receptive.  Coralyn Mark Katonya Blecher 12/31/2017, 4:39 PM

## 2017-12-31 NOTE — Progress Notes (Signed)
[facility] MD Progress Note  12/31/2017 10:20 AM Tanya Kelley  MRN:  [id number] Subjective:  47 year old married female, presented following suicide attempt by overdosing on flexeril and alcohol. Reports worsening depression, anxiety, neuro-vegetative symptoms of depression. States she has experienced a number of " recovered " memories from traumatic childhood and adulthood events particularly after participating in an internet based hypnosis program. States that in spite of assurances from family that these events did not occur, she ruminates about them, feels guilty, and worries she may be ostracized by people or arrested for things she allegedly did in the past .  Objective: Patient is seen and examined.  Patient is a 47 year old female with a past psychiatric history significant for bipolar disorder, most recently depressed.  She is seen in follow-up.  She is doing better today.  She slept fairly well, and is less lethargic this morning.  She told nursing earlier that she felt as though the 40 mg of Geodon was too much, but in our discussions this morning it sounds like it was okay but she remains somewhat sedated from the Seroquel yesterday.  She woke up this morning and did not feel draggy at all.  We discussed the fact that she was going to receive 20 mg of Geodon during the day today, and that would give [place] some indication of how the 40 mg would do this evening.  She stated she woke up this morning and 1 of her obsessional type things was singing religious music in her head is since she woke up.  She stated when she woke up at 3 AM this morning that was not present she is very happy about that.  Her mood and affect are significantly better.  She denied any other side effects to her current medications.  She had an episode of some tachycardia this morning, but otherwise her vital signs have been stable.  Principal Problem: <principal problem not specified> Diagnosis:   Patient Active Problem List   Diagnosis Date Noted  . Bipolar disorder current episode depressed ([place]) [F31.30] 12/27/2017  . Adjustment disorder with depressed mood [F43.21] 05/09/2017  . Delusional disorder ([place]) [F22] 05/09/2017  . Weight loss, unintentional [R63.4] 01/05/2017  . Breast mass [N63.0] 01/05/2017  . Right knee pain [M25.561] 11/19/2015  . Effusion of bursa of right knee [M25.461] 11/19/2015  . Lymphadenitis [I88.9] 05/18/2015  . 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 Hyperlipidemia LDL goal <100 [E78.5] 04/09/2015  . Vitamin D deficiency [E55.9] 04/09/2015  . Tobacco use disorder [F17.200] 04/06/2015  . Encounter to establish care [Z76.89] 04/06/2015  . Encounter for preventive health examination [Z00.00] 04/06/2015  . 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 Chest pain [R07.9]   . Hot flashes [R23.2]    Total Time spent with patient: 15 minutes  Past Psychiatric History: See admission H&P  Past Medical History:  Past Medical History:  Diagnosis Date  . ABDOMINAL BLOATING 04/10/2009   Qualifier: Diagnosis of  By: Nelson-Smith CMA (AAMA), Dottie    . Abdominal pain, unspecified site 04/10/2009   Qualifier: Diagnosis of  By: Nelson-Smith CMA (AAMA), Dottie    . ANEMIA, IRON DEFICIENCY 04/12/2009   Qualifier: Diagnosis of  By: Surface RN, Butch Penny    . B12 DEFICIENCY 04/12/2009   Qualifier: Diagnosis of  By: Surface RN, Butch Penny    . Barrett esophagus 2013  . CERVICAL RADICULOPATHY, RIGHT 06/26/2009   Qualifier: Diagnosis of  By: Lorelei Pont MD, Frederico Hamman    . Chest pain   .  Colon polyps   . CONSTIPATION 04/10/2009   Qualifier: Diagnosis of  By: Nelson-Smith CMA (AAMA), Dottie    . COPD 04/10/2009   no per pt 05-25-15  . Diarrhea 04/10/2009   Qualifier: Diagnosis of  By: Nelson-Smith CMA (AAMA), Dottie    . Fatigue   . GERD (gastroesophageal reflux disease)   . Hot flashes   . Hyperlipidemia   . Jaw pain   . MELENA 04/10/2009   Qualifier: Diagnosis of  By: Nelson-Smith CMA (AAMA), Dottie    . Nausea   . PONV  (postoperative nausea and vomiting)   . SACROILIITIS, RIGHT 06/26/2009   Qualifier: Diagnosis of  By: Lorelei Pont MD, Frederico Hamman    . Shoulder pain   . WEIGHT LOSS 04/10/2009   Qualifier: Diagnosis of  By: Harlon Ditty CMA (AAMA), Dottie      Past Surgical History:  Procedure Laterality Date  . CHOLECYSTECTOMY    . COLONOSCOPY    . EXCISION MORTON'S NEUROMA Bilateral 01/29/2017   Procedure: Morton's Neuroma Excision [employer];  Surgeon: Wylene Simmer, MD;  Location: [place];  Service: Orthopedics;  Laterality: Bilateral;  . FOOT SURGERY Bilateral   . TUBAL LIGATION    . UPPER GASTROINTESTINAL ENDOSCOPY  06/12/2015  . WEIL OSTEOTOMY Bilateral 01/29/2017   Procedure: Bilateral Second Metatarsal Weil Osteotomy;  Surgeon: Wylene Simmer, MD;  Location: [place];  Service: Orthopedics;  Laterality: Bilateral;   Family History:  Family History  Problem Relation Age of Onset  . Hypertension Father   . Clotting disorder Father   . Colon polyps Father   . Healthy Mother   . Healthy Brother   . Heart attack Paternal Grandfather   . Heart disease Paternal Grandfather   . Early death Paternal Grandfather 19       MI  . Colon cancer Paternal Grandmother 40       alive  . Clotting disorder Paternal Grandmother   . Colon polyps Paternal Aunt        x 2  . Esophageal cancer Neg Hx   . Rectal cancer Neg Hx   . Stomach cancer Neg Hx    Family Psychiatric  History: See admission H&P Social History:  Social History   Substance and Sexual Activity  Alcohol Use Yes   Comment: occassional      Social History   Substance and Sexual Activity  Drug Use No    Social History   Socioeconomic History  . Marital status: Married    Spouse name: Not on file  . Number of children: 3  . Years of education: Not on file  . Highest education level: Not on file  Occupational History  . Not on file  Social Needs  . Financial resource strain: Not hard at all  . Food  insecurity:    Worry: Never true    Inability: Never true  . Transportation needs:    Medical: No    Non-medical: No  Tobacco Use  . Smoking status: Current Some Day Smoker    Packs/day: 0.00    Years: 27.00    Pack years: 0.00    Types: Cigarettes  . Smokeless tobacco: Never Used  . Tobacco comment: only smokes occasionally, with drinks  Substance and Sexual Activity  . Alcohol use: Yes    Comment: occassional   . Drug use: No  . Sexual activity: Yes    Birth control/protection: Surgical    Comment: Tubal 2000  Lifestyle  . Physical  activity:    Days per week: 0 days    Minutes per session: 0 min  . Stress: Very much  Relationships  . Social connections:    Talks on phone: Twice a week    Gets together: Twice a week    Attends religious service: 1 to 4 times per year    Active member of club or organization: No    Attends meetings of clubs or organizations: Never    Relationship status: Married  Other Topics Concern  . Not on file  Social History Narrative   Married, Lorin Mercy. 3 children Bobette Mo, Maria Stein, [place]).    Some college. 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    Drinks caffeine rarely,  Has recently switched to decaf.   Wears seatbelt, smoke detector in the home.    Firearms in a locked case.    Feels safe in relationships.    Additional Social History:    Pain Medications: Please see MAR Prescriptions: Please see MAR Over the Counter: Please see MAR History of alcohol / drug use?: No history of alcohol / drug abuse Longest period of sobriety (when/how long): Pt denies Negative Consequences of Use: Legal, Personal relationships Withdrawal Symptoms: Change in blood pressure                    Sleep: Good  Appetite:  Fair  Current Medications: Current Facility-Administered Medications  Medication Dose Route Frequency Provider Last Rate Last Dose  . alum & mag hydroxide-simeth (MAALOX/MYLANTA) 200-200-20 MG/5ML suspension 30  mL  30 mL Oral Q4H PRN Lindon Romp A, NP   30 mL at 12/30/17 1725  . escitalopram (LEXAPRO) tablet 20 mg  20 mg Oral Daily Sharma Covert, MD   20 mg at 12/31/17 9357  . feeding supplement (ENSURE ENLIVE) (ENSURE ENLIVE) liquid 237 mL  237 mL Oral BID BM Cobos, Myer Peer, MD   237 mL at 12/30/17 1724  . guaiFENesin (MUCINEX) 12 hr tablet 600 mg  600 mg Oral BID Lindell Spar I, NP   600 mg at 12/31/17 0808  . LORazepam (ATIVAN) tablet 0.5 mg  0.5 mg Oral Q6H PRN Cobos, Myer Peer, MD      . nicotine (NICODERM CQ - dosed in mg/24 hours) patch 21 mg  21 mg Transdermal Daily Cobos, Myer Peer, MD   21 mg at 12/31/17 0902  . pantoprazole (PROTONIX) EC tablet 40 mg  40 mg Oral Daily Sharma Covert, MD   40 mg at 12/31/17 0177  . traZODone (DESYREL) tablet 25 mg  25 mg Oral QHS PRN Sharma Covert, MD      . ziprasidone (GEODON) capsule 20 mg  20 mg Oral Q breakfast Sharma Covert, MD   20 mg at 12/31/17 9390  . ziprasidone (GEODON) capsule 40 mg  40 mg Oral Q supper Sharma Covert, MD   40 mg at 12/30/17 1721    Lab Results:  Results for orders placed or performed during the hospital encounter of 12/27/17 (from the past 48 hour(s))  TSH     Status: None   Collection Time: 12/29/17  6:39 PM  Result Value Ref Range   TSH 1.491 0.350 - 4.500 uIU/mL    Comment: Performed by a 3rd Generation assay with a functional sensitivity of <=0.01 uIU/mL. Performed at [facility], [place] [address] [place], [place] [address]     Blood Alcohol level:  Lab Results  Component Value Date  ETH 145 (H) 12/26/2017   ETH <10 56/38/7564    Metabolic Disorder Labs: Lab Results  Component Value Date   HGBA1C 5.1 12/28/2017   MPG 99.67 12/28/2017   No results found for: PROLACTIN Lab Results  Component Value Date   CHOL 227 (H) 12/28/2017   TRIG 105 12/28/2017   HDL 40 (L) 12/28/2017   CHOLHDL 5.7 12/28/2017   VLDL 21 12/28/2017   LDLCALC 166 (H) 12/28/2017    LDLCALC 190 (H) 04/06/2015    Physical Findings: AIMS: Facial and Oral Movements Muscles of Facial Expression: None, normal Lips and Perioral Area: None, normal Jaw: None, normal Tongue: None, normal,Extremity Movements Upper (arms, wrists, hands, fingers): None, normal Lower (legs, knees, ankles, toes): None, normal, Trunk Movements Neck, shoulders, hips: None, normal, Overall Severity Severity of abnormal movements (highest score from questions above): None, normal Incapacitation due to abnormal movements: None, normal Patient's awareness of abnormal movements (rate only patient's report): No Awareness, Dental Status Current problems with teeth and/or dentures?: No Does patient usually wear dentures?: No  CIWA:  CIWA-Ar Total: 1 COWS:  COWS Total Score: 2  Musculoskeletal: Strength & Muscle Tone: within normal limits Gait & Station: normal Patient leans: N/A  Psychiatric Specialty Exam: Physical Exam  Nursing note and vitals reviewed. Constitutional: She is oriented to person, place, and time. She appears well-developed and well-nourished.  HENT:  Head: Normocephalic and atraumatic.  Respiratory: Effort normal.  Neurological: She is alert and oriented to person, place, and time.    ROS  Blood pressure (!) 117/107, pulse (!) 105, temperature 98.3 F (36.8 C), temperature source Oral, resp. rate 16, height 5\' 8"  (1.727 m), weight 64.9 kg, last menstrual period 05/18/2015, SpO2 98 %.Body mass index is 21.74 kg/m.  General Appearance: Casual  Eye Contact:  Fair  Speech:  Normal Rate  Volume:  Normal  Mood:  Anxious  Affect:  Congruent  Thought Process:  Coherent and Descriptions of Associations: Intact  Orientation:  Full (Time, Place, and Person)  Thought Content:  Logical  Suicidal Thoughts:  No  Homicidal Thoughts:  No  Memory:  Immediate;   Fair Recent;   Fair Remote;   Fair  Judgement:  Intact  Insight:  Fair  Psychomotor Activity:  Increased  Concentration:   Concentration: Fair and Attention Span: Fair  Recall:  [employer] of Knowledge:  Fair  Language:  Fair  Akathisia:  Negative  Handed:  Right  AIMS (if indicated):     Assets:  Communication Skills Desire for Improvement Financial Resources/Insurance Housing Leisure Time Physical Health Resilience Social Support  ADL's:  Intact  Cognition:  WNL  Sleep:  Number of Hours: 6.25     Treatment Plan Summary: Daily contact with patient to assess and evaluate symptoms and progress in treatment, Medication management and Plan : 47 year old married female, presented following suicide attempt by overdosing on flexeril and alcohol. Reports worsening depression, anxiety, neuro-vegetative symptoms of depression. States she has experienced a number of " recovered " memories from traumatic childhood and adulthood events particularly after participating in an internet based hypnosis program. States that in spite of assurances from family that these events did not occur, she ruminates about them, feels guilty, and worries she may be ostracized by people or arrested for things she allegedly did in the past .   #1 bipolar disorder/depression/anxiety-somewhat improved today with Geodon 20 mg every morning and 40 mg every afternoon.  We will continue this for now, but adjust for  either oversedation or other side effects. #2 anxiety/depression-continue Lexapro 20 mg p.o. daily, continue Ativan 0.5 mg every 6 hours as needed anxiety. #3 weight loss-TSH was normal, weight loss most likely related to anxiety and depression. #4 disposition planning-in progress. Sharma Covert, MD 12/31/2017, 10:20 AM

## 2018-01-01 MED ORDER — NICOTINE 21 MG/24HR TD PT24: 21.0000 mg | MEDICATED_PATCH | Freq: Every day | TRANSDERMAL | 0 refills | Status: DC

## 2018-01-01 MED ORDER — PANTOPRAZOLE SODIUM 40 MG PO TBEC: 40.0000 mg | DELAYED_RELEASE_TABLET | Freq: Every day | ORAL | 0 refills | Status: DC

## 2018-01-01 MED ORDER — ZIPRASIDONE HCL 20 MG PO CAPS: ORAL_CAPSULE | ORAL | 0 refills | Status: DC

## 2018-01-01 MED ORDER — TRAZODONE HCL 50 MG PO TABS: 25.0000 mg | ORAL_TABLET | Freq: Every evening | ORAL | 0 refills | Status: DC | PRN

## 2018-01-01 MED ORDER — ESCITALOPRAM OXALATE 20 MG PO TABS: 20.0000 mg | ORAL_TABLET | Freq: Every day | ORAL | 0 refills | Status: DC

## 2018-01-01 NOTE — Progress Notes (Signed)
  [facility] Adult Case Management Discharge Plan :  Will you be returning to the same living situation after discharge:  Yes,  patient reports she is returning home with her husband and son At discharge, do you have transportation home?: Yes,  patient reports her mother is picking her up at discharge Do you have the ability to pay for your medications: Yes,  BCBS, support from spouse  Release of information consent forms completed and in the chart;  Patient's signature needed at discharge.  Patient to Follow up at: Follow-up Information    [facility] on 01/11/2018.   Why:  Appointment for medication management services is Monday, January 11, 2018 at 2:30p.m.Please be sure to bring any discharge paperwork from this hospitalization, including your list of medications.  Contact information: [place] [address] phone: [phone]       [facility].   Specialty:  Behavioral Health Why:  Please be sure to follow up with therapy groups and services at discharge. Refer to the pamphlet provided. Please call to schedule orientation appointment to establish services.  Contact information: [address], [place] [address] [phone]           Next level of care provider has access to [place] and Suicide Prevention discussed: Yes,  with adult daughter     Has patient been referred to the Quitline?: N/A patient is not a smoker  Patient has been referred for addiction treatment: N/A  Marylee Floras, LCSWA 01/01/2018, 11:24 AM

## 2018-01-01 NOTE — Progress Notes (Signed)
Recreation Therapy Notes  Date: 11.8.19 Time: 0930 Location: [address] Dayroom  Group Topic: Stress Management  Goal Area(s) Addresses:  Patient will verbalize importance of using healthy stress management.  Patient will identify positive emotions associated with healthy stress management.   Intervention: Stress Management  Activity :  Meditation.  LRT introduced the stress management technique of meditation.  LRT played a meditation that allowed patients to embody the stillness of a mountain.  Patients were to listen and follow along as the meditation played.  Education:  Stress Management, Discharge Planning.   Education Outcome: Acknowledges edcuation/In group clarification offered/Needs additional education  Clinical Observations/Feedback: Pt did not attend group.     Victorino Sparrow, LRT/CTRS         Victorino Sparrow A 01/01/2018 11:28 AM
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
## 2018-01-01 NOTE — Progress Notes (Signed)
D:  Patient's self inventory sheet, patient sleeps good, no sleep medication.  Good appetite, normal energy level, good concentration.  Denied depression, hopeless and anxiety.  Denied withdrawals.  Denied SI.  Denied physical problems.  Denied physical pain.  Goal is attend gorups, discharge plans, positive attitude.  Plans to attend gorup, positive thoughts.  No discharge plans. A:  Medications administered per MD orders.  Emotional support and encouragement given patient. R:  Denied SI and HI, contracts for safety.  Denied A/V hallucinations.  Safety maintained with 15 minute checks.

## 2018-01-01 NOTE — Progress Notes (Signed)
Discharge Note:  Patient discharged home with family member.  Patient denied SI and HI, contracts for safety.  Denied A/V hallucinations.  Suicide prevention information given and discussed with patient who stated she understood and had no questions.  My[employer] suicide prevention information given to patient.  Patient filled out survey and put in black box.  Patient stated she received all her belongings, clothing, prescriptions, misc items, etc.  Patient stated she appreciated all assistance received from [facility] staff.  All required discharge information given to patient at discharge.

## 2018-01-01 NOTE — [facility] Suicide Risk Assessment (Signed)
[facility] Discharge Suicide Risk Assessment   Principal Problem: Bipolar disorder current episode depressed [facility]) Discharge Diagnoses:  Patient Active Problem List   Diagnosis Date Noted  . Bipolar disorder current episode depressed ([place]) [F31.30] 12/27/2017    Priority: High  . Chest pain [R07.9]     Priority: High  . Adjustment disorder with depressed mood [F43.21] 05/09/2017  . Delusional disorder ([place]) [F22] 05/09/2017  . Weight loss, unintentional [R63.4] 01/05/2017  . Breast mass [N63.0] 01/05/2017  . Right knee pain [M25.561] 11/19/2015  . Effusion of bursa of right knee [M25.461] 11/19/2015  . Lymphadenitis [I88.9] 05/18/2015  . 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 Hyperlipidemia LDL goal <100 [E78.5] 04/09/2015  . Vitamin D deficiency [E55.9] 04/09/2015  . Tobacco use disorder [F17.200] 04/06/2015  . Encounter to establish care [Z76.89] 04/06/2015  . Encounter for preventive health examination [Z00.00] 04/06/2015  . 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 Hot flashes [R23.2]     Total Time spent with patient: 15 minutes  Musculoskeletal: Strength & Muscle Tone: within normal limits Gait & Station: normal Patient leans: N/A  Psychiatric Specialty Exam: Review of Systems  All other systems reviewed and are negative.   Blood pressure 103/64, pulse (!) 114, temperature 97.7 F (36.5 C), temperature source Oral, resp. rate 20, height 5\' 8"  (1.727 m), weight 64.9 kg, last menstrual period 05/18/2015, SpO2 98 %.Body mass index is 21.74 kg/m.  General Appearance: Casual  Eye Contact::  Good  Speech:  Normal Rate409  Volume:  Normal  Mood:  Euthymic  Affect:  Congruent  Thought Process:  Coherent and Descriptions of Associations: Intact  Orientation:  Full (Time, Place, and Person)  Thought Content:  Logical  Suicidal Thoughts:  No  Homicidal Thoughts:  No  Memory:  Immediate;   Fair Recent;   Fair Remote;   Fair  Judgement:  Intact  Insight:  Fair  Psychomotor  Activity:  Normal  Concentration:  Good  Recall:  Good  Fund of Knowledge:Good  Language: Good  Akathisia:  Negative  Handed:  Right  AIMS (if indicated):     Assets:  Communication Skills Desire for Improvement Financial Resources/Insurance Housing Intimacy Leisure Time Physical Health Resilience Social Support  Sleep:  Number of Hours: 6.75  Cognition: WNL  ADL's:  Intact   Mental Status Per Nursing Assessment::   On Admission:  Suicidal ideation indicated by patient  Demographic Factors:  Caucasian  Loss Factors: NA  Historical Factors: Impulsivity  Risk Reduction Factors:   Sense of responsibility to family, Living with another person, especially a relative and Positive social support  Continued Clinical Symptoms:  Bipolar Disorder:   Depressive phase  Cognitive Features That Contribute To Risk:  None    Suicide Risk:  Minimal: No identifiable suicidal ideation.  Patients presenting with no risk factors but with morbid ruminations; may be classified as minimal risk based on the severity of the depressive symptoms  Follow-up Information    [facility]. Go on 01/11/2018.   Why:  Your hospital follow up appointment is Monday November 18 at 2:30p.m. Contact information: [address] [place] [address] phone: [phone]          Plan Of Care/Follow-up recommendations:  Activity:  ad lib  Sharma Covert, MD 01/01/2018, 7:47 AM

## 2018-01-01 NOTE — Tx Team (Signed)
Interdisciplinary Treatment and Diagnostic Plan Update  01/01/2018 Time of Session:  Tanya Kelley MRN: [id number]  Principal Diagnosis: Bipolar disorder current episode depressed [facility])  Secondary Diagnoses: Principal Problem:   Bipolar disorder current episode depressed ([place]) Active Problems:   Delusional disorder ([place])   Current Medications:  Current Facility-Administered Medications  Medication Dose Route Frequency Provider Last Rate Last Dose  . alum & mag hydroxide-simeth (MAALOX/MYLANTA) 200-200-20 MG/5ML suspension 30 mL  30 mL Oral Q4H PRN Lindon Romp A, NP   30 mL at 12/30/17 1725  . escitalopram (LEXAPRO) tablet 20 mg  20 mg Oral Daily Sharma Covert, MD   20 mg at 01/01/18 0740  . feeding supplement (ENSURE ENLIVE) (ENSURE ENLIVE) liquid 237 mL  237 mL Oral BID BM Cobos, Myer Peer, MD   237 mL at 01/01/18 0743  . guaiFENesin (MUCINEX) 12 hr tablet 600 mg  600 mg Oral BID Lindell Spar I, NP   600 mg at 01/01/18 0740  . LORazepam (ATIVAN) tablet 0.5 mg  0.5 mg Oral Q6H PRN Cobos, Myer Peer, MD      . nicotine (NICODERM CQ - dosed in mg/24 hours) patch 21 mg  21 mg Transdermal Daily Cobos, Myer Peer, MD   21 mg at 01/01/18 0740  . pantoprazole (PROTONIX) EC tablet 40 mg  40 mg Oral Daily Sharma Covert, MD   40 mg at 01/01/18 0741  . traZODone (DESYREL) tablet 25 mg  25 mg Oral QHS PRN Sharma Covert, MD      . ziprasidone (GEODON) capsule 20 mg  20 mg Oral Q breakfast Sharma Covert, MD   20 mg at 01/01/18 0741  . ziprasidone (GEODON) capsule 40 mg  40 mg Oral Q supper Sharma Covert, MD   40 mg at 12/31/17 2006   PTA Medications: Medications Prior to Admission  Medication Sig Dispense Refill Last Dose  . docusate sodium (COLACE) 100 MG capsule Take 1 capsule (100 mg total) by mouth 2 (two) times daily. While taking narcotic pain medicine. (Patient not taking: Reported on 05/08/2017) 30 capsule 0 Unknown at Unknown time  . ibuprofen (ADVIL,MOTRIN)  600 MG tablet Take 1 tablet (600 mg total) by mouth every 6 (six) hours as needed. (Patient not taking: Reported on 05/08/2017) 30 tablet 0 Unknown at Unknown time  . oxyCODONE (ROXICODONE) 5 MG immediate release tablet Take 1 tablet (5 mg total) by mouth every 4 (four) hours as needed for moderate pain or severe pain. For no more than 4 days. (Patient not taking: Reported on 05/08/2017) 20 tablet 0 Unknown at Unknown time  . senna (SENOKOT) 8.6 MG TABS tablet Take 2 tablets (17.2 mg total) by mouth 2 (two) times daily. (Patient not taking: Reported on 05/08/2017) 30 each 0 Unknown at Unknown time  . valACYclovir (VALTREX) 1000 MG tablet Take 2 tabs with onset of symptoms and then 2 tabs in 12 hours PRN for cold sore (Patient not taking: Reported on 05/08/2017) 20 tablet 3 Unknown at Unknown time    Patient Stressors: Educational concerns Financial difficulties Health problems  Patient Strengths: Ability for insight Active sense of humor Average or above average intelligence  Treatment Modalities: Medication Management, Group therapy, Case management,  1 to 1 session with clinician, Psychoeducation, Recreational therapy.   Physician Treatment Plan for Primary Diagnosis: Bipolar disorder current episode depressed ([place]) Long Term Goal(s): Improvement in symptoms so as ready for discharge Improvement in symptoms so as ready for discharge   Short  Term Goals: Ability to identify changes in lifestyle to reduce recurrence of condition will improve Ability to maintain clinical measurements within normal limits will improve Ability to identify changes in lifestyle to reduce recurrence of condition will improve Ability to maintain clinical measurements within normal limits will improve  Medication Management: Evaluate patient's response, side effects, and tolerance of medication regimen.  Therapeutic Interventions: 1 to 1 sessions, Unit Group sessions and Medication administration.  Evaluation of  Outcomes: Adequate for Discharge  Physician Treatment Plan for Secondary Diagnosis: Principal Problem:   Bipolar disorder current episode depressed ([place]) Active Problems:   Delusional disorder ([place])  Long Term Goal(s): Improvement in symptoms so as ready for discharge Improvement in symptoms so as ready for discharge   Short Term Goals: Ability to identify changes in lifestyle to reduce recurrence of condition will improve Ability to maintain clinical measurements within normal limits will improve Ability to identify changes in lifestyle to reduce recurrence of condition will improve Ability to maintain clinical measurements within normal limits will improve     Medication Management: Evaluate patient's response, side effects, and tolerance of medication regimen.  Therapeutic Interventions: 1 to 1 sessions, Unit Group sessions and Medication administration.  Evaluation of Outcomes: Adequate for Discharge   RN Treatment Plan for Primary Diagnosis: Bipolar disorder current episode depressed ([place]) Long Term Goal(s): Knowledge of disease and therapeutic regimen to maintain health will improve  Short Term Goals: Ability to participate in decision making will improve, Ability to verbalize feelings will improve, Ability to disclose and discuss suicidal ideas and Ability to identify and develop effective coping behaviors will improve  Medication Management: RN will administer medications as ordered by provider, will assess and evaluate patient's response and provide education to patient for prescribed medication. RN will report any adverse and/or side effects to prescribing provider.  Therapeutic Interventions: 1 on 1 counseling sessions, Psychoeducation, Medication administration, Evaluate responses to treatment, Monitor vital signs and CBGs as ordered, Perform/monitor CIWA, COWS, AIMS and Fall Risk screenings as ordered, Perform wound care treatments as ordered.  Evaluation of Outcomes:  Adequate for Discharge   LCSW Treatment Plan for Primary Diagnosis: Bipolar disorder current episode depressed ([place]) Long Term Goal(s): Safe transition to appropriate next level of care at discharge, Engage patient in therapeutic group addressing interpersonal concerns.  Short Term Goals: Engage patient in aftercare planning with referrals and resources  Therapeutic Interventions: Assess for all discharge needs, 1 to 1 time with Social worker, Explore available resources and support systems, Assess for adequacy in community support network, Educate family and significant other(s) on suicide prevention, Complete Psychosocial Assessment, Interpersonal group therapy.  Evaluation of Outcomes: Adequate for Discharge   Progress in Treatment: Attending groups: Yes. Participating in groups: Yes. Taking medication as prescribed: Yes. Toleration medication: Yes. Family/Significant other contact made: No, will contact:  the patient's daughter Patient understands diagnosis: Yes. Discussing patient identified problems/goals with staff: Yes. Medical problems stabilized or resolved: Yes. Denies suicidal/homicidal ideation: Yes. Issues/concerns per patient self-inventory: No. Other:  New problem(s) identified: None   New Short Term/Long Term Goal(s): medication stabilization, elimination of SI thoughts, development of comprehensive mental wellness plan.    Patient Goals:   Help with MDD, SI and PTSD  Discharge Plan or Barriers: Patientis returning home with her family and plans to follow up with [facility] for medication management and the [place] for therapy services.   Reason for Continuation of Hospitalization: None   Estimated Length of Stay: Discharged, 01/01/18   Attendees:  Patient: Tanya Kelley 01/01/2018 8:42 AM  Physician: Dr. Neita Garnet, MD; Dr. Myles Lipps, MD 01/01/2018 8:42 AM  Nursing: Legrand Como.S, RN; Rise Paganini.Raliegh Ip, RN  01/01/2018 8:42 AM  RN Care Manager:  Lars Pinks, RN 01/01/2018 8:42 AM  Social Worker: Theresa Duty 01/01/2018 8:42 AM  Recreational Therapist: Rhunette Croft 01/01/2018 8:42 AM  Other: Agustina Caroli, NP 01/01/2018 8:42 AM  Other:  01/01/2018 8:42 AM  Other: 01/01/2018 8:42 AM    Scribe for Treatment Team: Marylee Floras, [place] 01/01/2018 8:42 AM

## 2018-01-27 ENCOUNTER — Other Ambulatory Visit: Payer: Self-pay | Admitting: Family Medicine

## 2018-01-27 DIAGNOSIS — Z1231 Encounter for screening mammogram for malignant neoplasm of breast: Secondary | ICD-10-CM

## 2018-02-16 ENCOUNTER — Ambulatory Visit
Admission: RE | Admit: 2018-02-16 | Discharge: 2018-02-16 | Disposition: A | Discharge status: Home / Self Care | Payer: BLUE CROSS/BLUE SHIELD | Source: Ambulatory Visit | Attending: Family Medicine | Admitting: Family Medicine

## 2018-02-16 DIAGNOSIS — Z1231 Encounter for screening mammogram for malignant neoplasm of breast: Secondary | ICD-10-CM

## 2018-08-10 ENCOUNTER — Other Ambulatory Visit: Payer: Self-pay

## 2018-08-24 ENCOUNTER — Encounter: Payer: BLUE CROSS/BLUE SHIELD | Admitting: Gastroenterology

## 2019-01-26 ENCOUNTER — Other Ambulatory Visit: Payer: Self-pay

## 2019-01-26 DIAGNOSIS — Z20822 Contact with and (suspected) exposure to covid-19: Secondary | ICD-10-CM

## 2019-01-30 LAB — NOVEL CORONAVIRUS, NAA: SARS-CoV-2, NAA: NOT DETECTED

## 2019-05-19 ENCOUNTER — Ambulatory Visit: Payer: Self-pay | Attending: Internal Medicine

## 2019-05-19 DIAGNOSIS — Z23 Encounter for immunization: Secondary | ICD-10-CM

## 2019-05-19 NOTE — Progress Notes (Signed)
   Covid-19 Vaccination Clinic  Name:  Tanya Kelley    MRN: [id number] DOB: Apr 21, 1970  05/19/2019  [name] was observed post Covid-19 immunization for 15 minutes without incident. She was provided with Vaccine Information Sheet and instruction to access the V-Safe system.   [name] was instructed to call 911 with any severe reactions post vaccine: Marland Kitchen Difficulty breathing  . Swelling of face and throat  . A fast heartbeat  . A bad rash all over body  . Dizziness and weakness   Immunizations Administered    Name Date Dose VIS Date Route   Pfizer COVID-19 Vaccine 05/19/2019  2:27 PM 0.3 mL 02/04/2019 Intramuscular   Manufacturer: [place]   Lot: [id number]   [place]: [id number]

## 2019-06-13 ENCOUNTER — Ambulatory Visit: Payer: Self-pay | Attending: Internal Medicine

## 2019-06-13 DIAGNOSIS — Z23 Encounter for immunization: Secondary | ICD-10-CM

## 2019-06-13 NOTE — Progress Notes (Signed)
   Covid-19 Vaccination Clinic  Name:  Tanya Kelley    MRN: [id number] DOB: September 10, 1970  06/13/2019  [name] was observed post Covid-19 immunization for 15 minutes without incident. She was provided with Vaccine Information Sheet and instruction to access the V-Safe system.   [name] was instructed to call 911 with any severe reactions post vaccine: Marland Kitchen Difficulty breathing  . Swelling of face and throat  . A fast heartbeat  . A bad rash all over body  . Dizziness and weakness   Immunizations Administered    Name Date Dose VIS Date Route   Pfizer COVID-19 Vaccine 06/13/2019  2:41 PM 0.3 mL 04/20/2018 Intramuscular   Manufacturer: [place]   Lot: [id number]   [place]: [id number]

## 2019-12-22 ENCOUNTER — Ambulatory Visit (AMBULATORY_SURGERY_CENTER): Payer: Self-pay

## 2019-12-22 ENCOUNTER — Other Ambulatory Visit: Payer: Self-pay

## 2019-12-22 VITALS — Ht 68.0 in | Wt 188.0 lb

## 2019-12-22 DIAGNOSIS — K22719 Barrett's esophagus with dysplasia, unspecified: Secondary | ICD-10-CM

## 2019-12-22 NOTE — Progress Notes (Signed)
No egg or soy allergy known to patient  No issues with past sedation with any surgeries or procedures No intubation problems in the past  No FH of Malignant Hyperthermia No diet pills per patient No home 02 use per patient  No blood thinners per patient  Pt denies issues with constipation  No A fib or A flutter  EMMI video via [place] 19 guidelines implemented in PV today with Pt and RN  COVID vaccines completed on 05/2019 per pt;  Due to the COVID-19 pandemic we are asking patients to follow these guidelines. Please only bring one care partner. Please be aware that your care partner may wait in the car in the parking lot or if they feel like they will be too hot to wait in the car, they may wait in the lobby on the 4th floor. All care partners are required to wear a mask the entire time (we do not have any that we can provide them), they need to practice social distancing, and we will do a Covid check for all patient's and care partners when you arrive. Also we will check their temperature and your temperature. If the care partner waits in their car they need to stay in the parking lot the entire time and we will call them on their cell phone when the patient is ready for discharge so they can bring the car to the front of the building. Also all patient's will need to wear a mask into building.

## 2020-01-05 ENCOUNTER — Encounter: Payer: Self-pay | Admitting: Gastroenterology

## 2020-01-05 ENCOUNTER — Other Ambulatory Visit: Payer: Self-pay

## 2020-01-05 ENCOUNTER — Ambulatory Visit (AMBULATORY_SURGERY_CENTER): Payer: 59 | Admitting: Gastroenterology

## 2020-01-05 VITALS — BP 124/64 | HR 76 | Temp 98.4°F | Resp 12 | Ht 68.0 in | Wt 188.0 lb

## 2020-01-05 DIAGNOSIS — K449 Diaphragmatic hernia without obstruction or gangrene: Secondary | ICD-10-CM | POA: Diagnosis not present

## 2020-01-05 DIAGNOSIS — K2270 Barrett's esophagus without dysplasia: Secondary | ICD-10-CM

## 2020-01-05 HISTORY — PX: UPPER GASTROINTESTINAL ENDOSCOPY: SHX188

## 2020-01-05 MED ORDER — SODIUM CHLORIDE 0.9 % IV SOLN: 500.0000 mL | Freq: Once | INTRAVENOUS | Status: AC

## 2020-01-05 NOTE — Patient Instructions (Signed)
Continue your present medications, but consider a two week trial of dexilant .  You were given samples.  YOU HAD AN ENDOSCOPIC PROCEDURE TODAY AT [facility]:   Refer to the procedure report that was given to you for any specific questions about what was found during the examination.  If the procedure report does not answer your questions, please call your gastroenterologist to clarify.  If you requested that your care partner not be given the details of your procedure findings, then the procedure report has been included in a sealed envelope for you to review at your convenience later.  YOU SHOULD EXPECT: Some feelings of bloating in the abdomen. Passage of more gas than usual.  Walking can help get rid of the air that was put into your GI tract during the procedure and reduce the bloating.   Please Note:  You might notice some irritation and congestion in your nose or some drainage.  This is from the oxygen used during your procedure.  There is no need for concern and it should clear up in a day or so.  SYMPTOMS TO REPORT IMMEDIATELY:   Following upper endoscopy (EGD)  Vomiting of blood or coffee ground material  New chest pain or pain under the shoulder blades  Painful or persistently difficult swallowing  New shortness of breath  Fever of 100F or higher  Black, tarry-looking stools  For urgent or emergent issues, a gastroenterologist can be reached at any hour by calling [phone]. Do not use MyChart messaging for urgent concerns.    DIET:  We do recommend a small meal at first, but then you may proceed to your regular diet.  Drink plenty of fluids but you should avoid alcoholic beverages for 24 hours.  ACTIVITY:  You should plan to take it easy for the rest of today and you should NOT DRIVE or use heavy machinery until tomorrow (because of the sedation medicines used during the test).    FOLLOW UP: Our staff will call the number listed on your records 48-72  hours following your procedure to check on you and address any questions or concerns that you may have regarding the information given to you following your procedure. If we do not reach you, we will leave a message.  We will attempt to reach you two times.  During this call, we will ask if you have developed any symptoms of COVID 19. If you develop any symptoms (ie: fever, flu-like symptoms, shortness of breath, cough etc.) before then, please call [phone].  If you test positive for Covid 19 in the 2 weeks post procedure, please call and report this information to [place].    If any biopsies were taken you will be contacted by phone or by letter within the next 1-3 weeks.  Please call us at [phone] if you have not heard about the biopsies in 3 weeks.    SIGNATURES/CONFIDENTIALITY: You and/or your care partner have signed paperwork which will be entered into your electronic medical record.  These signatures attest to the fact that that the information above on your After Visit Summary has been reviewed and is understood.  Full responsibility of the confidentiality of this discharge information lies with you and/or your care-partner.

## 2020-01-05 NOTE — Progress Notes (Signed)
Lidocaine 100mg IV given to blunt gag reflex 

## 2020-01-05 NOTE — Progress Notes (Signed)
Pt Drowsy. VSS. To PACU, report to RN. No anesthetic complications noted.  

## 2020-01-05 NOTE — Progress Notes (Signed)
Pt's states no medical or surgical changes since previsit or office visit.  Vitals FD

## 2020-01-05 NOTE — Progress Notes (Signed)
Patient states "bad" abd pain.  Stated #$.  Dr notified and he suggested levsin. Given under tongue stat.  Patient reading her report with no complaints at this time.

## 2020-01-05 NOTE — Op Note (Signed)
[place] Patient Name: Tanya Kelley Procedure Date: 01/05/2020 3:27 PM MRN: [id number] Endoscopist: Remo Lipps P. Havery Moros , MD Age: 49 Referring MD:  Date of Birth: 23-May-1970 Gender: Female Account #: [id number] Procedure:                Upper GI endoscopy Indications:              Surveillance for malignancy due to personal history                            of Barrett's esophagus - short segment last                            evaluated in 05/2015. Has history of reflux,                            currently poorly controlled on omeprazole 20mg  /                            day. Previously was on omeprazole / protonix 40mg                             BID Medicines:                Monitored Anesthesia Care Procedure:                Pre-Anesthesia Assessment:                           - Prior to the procedure, a History and Physical                            was performed, and patient medications and                            allergies were reviewed. The patient's tolerance of                            previous anesthesia was also reviewed. The risks                            and benefits of the procedure and the sedation                            options and risks were discussed with the patient.                            All questions were answered, and informed consent                            was obtained. Prior Anticoagulants: The patient has                            taken no previous anticoagulant or antiplatelet  agents. ASA Grade Assessment: III - A patient with                            severe systemic disease. After reviewing the risks                            and benefits, the patient was deemed in                            satisfactory condition to undergo the procedure.                           After obtaining informed consent, the endoscope was                            passed under direct vision. Throughout the                             procedure, the patient's blood pressure, pulse, and                            oxygen saturations were monitored continuously. The                            Endoscope was introduced through the mouth, and                            advanced to the second part of duodenum. The upper                            GI endoscopy was accomplished without difficulty.                            The patient tolerated the procedure well. Scope In: Scope Out: Findings:                 Esophagogastric landmarks were identified: the                            Z-line was found at 36 cm, the gastroesophageal                            junction was found at 37 cm and the upper extent of                            the gastric folds was found at 38 cm from the                            incisors.                           A 1 cm hiatal hernia was present.  There were esophageal mucosal changes classified as                            Barrett's stage C0-M1 per [place] criteria present                            in the lower third of the esophagus. The maximum                            longitudinal extent of these mucosal changes was                            8-10 mm in length. Biopsies were taken with a cold                            forceps for histology.                           The exam of the esophagus was otherwise normal.                           The entire examined stomach was normal.                           The duodenal bulb and second portion of the                            duodenum were normal. Complications:            No immediate complications. Estimated blood loss:                            Minimal. Estimated Blood Loss:     Estimated blood loss was minimal. Impression:               - Esophagogastric landmarks identified.                           - 1 cm hiatal hernia.                           - Esophageal mucosal changes with suspected  very                            short segment of Barrett's - unchanged since last                            exam. Biopsied.                           - Normal esophagus otherwise - no evidence of                            esophagitis despite patient's reported worsening  reflux symptoms                           - Normal stomach.                           - Normal duodenal bulb and second portion of the                            duodenum. Recommendation:           - Patient has a contact number available for                            emergencies. The signs and symptoms of potential                            delayed complications were discussed with the                            patient. Return to normal activities tomorrow.                            Written discharge instructions were provided to the                            patient.                           - Resume previous diet.                           - Continue present medications.                           - Stop omeprazole due to ongoing symptoms. Consider                            trial of Dexilant 60mg  / day for 2 weeks to see if                            this works better, will see if we have any samples                            at the office to offer the patient                           - Await pathology results. Remo Lipps P. [name], MD 01/05/2020 3:50:58 PM This report has been signed electronically.

## 2020-01-05 NOTE — Progress Notes (Signed)
Called to room to assist during endoscopic procedure.  Patient ID and intended procedure confirmed with present staff. Received instructions for my participation in the procedure from the performing physician.  

## 2020-01-09 ENCOUNTER — Telehealth: Payer: Self-pay | Admitting: *Deleted

## 2020-01-09 ENCOUNTER — Telehealth: Payer: Self-pay

## 2020-01-09 NOTE — Telephone Encounter (Signed)
Called [phone] and left a messaged we tried to reach pt for a follow up call. maw

## 2020-01-09 NOTE — Telephone Encounter (Signed)
°  Follow up Call-  Call back number 01/05/2020  Post procedure Call Back phone  # [phone]  Permission to leave phone message Yes  Some recent data might be hidden     Patient questions:  Do you have a fever, pain , or abdominal swelling? No. Pain Score  0 *  Have you tolerated food without any problems? Yes.    Have you been able to return to your normal activities? Yes.    Do you have any questions about your discharge instructions: Diet   No. Medications  No. Follow up visit  No.  Do you have questions or concerns about your Care? No.  Actions: * If pain score is 4 or above: No action needed, pain <4.  1. Have you developed a fever since your procedure? no  2.   Have you had an respiratory symptoms (SOB or cough) since your procedure? no  3.   Have you tested positive for COVID 19 since your procedure no  4.   Have you had any family members/close contacts diagnosed with the COVID 19 since your procedure?  no   If yes to any of these questions please route to Joylene John, RN and Joella Prince, RN

## 2020-03-15 ENCOUNTER — Other Ambulatory Visit: Payer: Self-pay

## 2020-03-15 ENCOUNTER — Emergency Department
Admission: EM | Admit: 2020-03-15 | Discharge: 2020-03-15 | Disposition: A | Discharge status: Home / Self Care | Payer: 59 | Source: Home / Self Care

## 2020-03-15 ENCOUNTER — Emergency Department: Admit: 2020-03-15 | Discharge status: Absent without leave | Payer: Self-pay

## 2020-03-15 ENCOUNTER — Emergency Department (INDEPENDENT_AMBULATORY_CARE_PROVIDER_SITE_OTHER): Payer: 59

## 2020-03-15 DIAGNOSIS — J069 Acute upper respiratory infection, unspecified: Secondary | ICD-10-CM

## 2020-03-15 DIAGNOSIS — U071 COVID-19: Secondary | ICD-10-CM | POA: Diagnosis not present

## 2020-03-15 DIAGNOSIS — R059 Cough, unspecified: Secondary | ICD-10-CM | POA: Diagnosis not present

## 2020-03-15 DIAGNOSIS — R079 Chest pain, unspecified: Secondary | ICD-10-CM

## 2020-03-15 DIAGNOSIS — R062 Wheezing: Secondary | ICD-10-CM

## 2020-03-15 MED ORDER — ALBUTEROL SULFATE (2.5 MG/3ML) 0.083% IN NEBU: 2.5000 mg | INHALATION_SOLUTION | Freq: Four times a day (QID) | RESPIRATORY_TRACT | 12 refills | Status: DC | PRN

## 2020-03-15 MED ORDER — PREDNISONE 20 MG PO TABS: ORAL_TABLET | ORAL | 0 refills | Status: DC

## 2020-03-15 MED ORDER — ALBUTEROL SULFATE HFA 108 (90 BASE) MCG/ACT IN AERS: 2.0000 | INHALATION_SPRAY | RESPIRATORY_TRACT | 0 refills | Status: DC | PRN

## 2020-03-15 NOTE — ED Provider Notes (Signed)
[name]    CSN: [id number] Arrival date & time: 03/15/20  1554      History   Chief Complaint Chief Complaint  Patient presents with  . Cough    Rapid at home yesterday    HPI Tanya Kelley is a 50 y.o. female.   HPI  Tanya Kelley is a 50 y.o. female presenting to UC with c/o cough, congestion, chills, and chest tightness for 1 week. She reports being exposed to COVID 1 week ago. She took a rapid test at [facility] yesterday, which was positive for COVID.  Denies fever, n/v/d. She has taken robitussin, tylenol and mucinex with mild relief. Pt has had 2 doses of COVID vaccine, last dose in April 2021. She is a smoker but no hx of asthma. Hx of bronchitis in the past. Has used inhaler in the past when sick.  Past Medical History:  Diagnosis Date  . ABDOMINAL BLOATING 04/10/2009   Qualifier: Diagnosis of  By: Nelson-Smith CMA (AAMA), Dottie    . Abdominal pain, unspecified site 04/10/2009   Qualifier: Diagnosis of  By: Nelson-Smith CMA (AAMA), Dottie    . ANEMIA, IRON DEFICIENCY 04/12/2009   Qualifier: Diagnosis of  By: Surface RN, Butch Penny    . Anxiety    hx of  . B12 DEFICIENCY 04/12/2009   Qualifier: Diagnosis of  By: Surface RN, Butch Penny    . Barrett esophagus 2013  . CERVICAL RADICULOPATHY, RIGHT 06/26/2009   Qualifier: Diagnosis of  By: Lorelei Pont MD, Frederico Hamman    . Chest pain   . Colon polyps   . CONSTIPATION 04/10/2009   Qualifier: Diagnosis of  By: Nelson-Smith CMA (AAMA), Dottie    . COPD 04/10/2009   no per pt 05-25-15  . Depression    hx of  . Diarrhea 04/10/2009   Qualifier: Diagnosis of  By: Nelson-Smith CMA (AAMA), Dottie    . Fatigue   . GERD (gastroesophageal reflux disease)    OTC meds at times  . Hot flashes   . Hyperlipidemia    hx of  . Jaw pain   . MELENA 04/10/2009   Qualifier: Diagnosis of  By: Nelson-Smith CMA (AAMA), Dottie    . Nausea   . PONV (postoperative nausea and vomiting)   . SACROILIITIS, RIGHT 06/26/2009    Qualifier: Diagnosis of  By: Lorelei Pont MD, Frederico Hamman  ---hx of  . Shoulder pain   . WEIGHT LOSS 04/10/2009   Qualifier: Diagnosis of  By: Harlon Ditty CMA (AAMA), Dottie      Patient Active Problem List   Diagnosis Date Noted  . Bipolar disorder current episode depressed ([place]) 12/27/2017  . Adjustment disorder with depressed mood 05/09/2017  . Delusional disorder ([place]) 05/09/2017  . Weight loss, unintentional 01/05/2017  . Breast mass 01/05/2017  . Right knee pain 11/19/2015  . Effusion of bursa of right knee 11/19/2015  . Lymphadenitis 05/18/2015  . 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 Hyperlipidemia LDL goal <100 04/09/2015  . Vitamin D deficiency 04/09/2015  . Tobacco use disorder 04/06/2015  . Encounter to establish care 04/06/2015  . Encounter for preventive health examination 04/06/2015  . Barrett esophagus   . Chest pain   . Hot flashes     Past Surgical History:  Procedure Laterality Date  . CHOLECYSTECTOMY    . COLONOSCOPY    . EXCISION MORTON'S NEUROMA Bilateral 01/29/2017   Procedure: Morton's Neuroma Excision [employer];  Surgeon: Wylene Simmer, MD;  Location: [place];  Service: Orthopedics;  Laterality: Bilateral;  . FOOT SURGERY Bilateral   . TUBAL LIGATION    . UPPER GASTROINTESTINAL ENDOSCOPY  06/12/2015  . UPPER GASTROINTESTINAL ENDOSCOPY  01/05/2020  . WEIL OSTEOTOMY Bilateral 01/29/2017   Procedure: Bilateral Second Metatarsal Weil Osteotomy;  Surgeon: Wylene Simmer, MD;  Location: [place];  Service: Orthopedics;  Laterality: Bilateral;    OB History    Gravida  3   Para  3   Term      Preterm      AB      Living  3     SAB      IAB      Ectopic      Multiple      Live Births               Home Medications    Prior to Admission medications   Medication Sig Start Date End Date Taking? Authorizing Provider  albuterol (VENTOLIN HFA) 108 (90 Base) MCG/ACT inhaler Inhale 2 puffs into the lungs  every 4 (four) hours as needed for wheezing or shortness of breath. 03/15/20  Yes Noe Gens, PA-C  predniSONE (DELTASONE) 20 MG tablet 3 tabs po day one, then 2 po daily x 4 days 03/15/20  Yes Zenna Traister O, PA-C  omeprazole (PRILOSEC) 20 MG capsule Take 20 mg by mouth daily.    [provider]  valACYclovir (VALTREX) 1000 MG tablet Take by mouth daily as needed. 02/15/19   [provider]    Family History Family History  Problem Relation Age of Onset  . Hypertension Father   . Clotting disorder Father   . Colon polyps Father   . Healthy Mother   . Healthy Brother   . Heart attack Paternal Grandfather   . Heart disease Paternal Grandfather   . Early death Paternal Grandfather 43       MI  . Colon cancer Paternal Grandmother 23  . Clotting disorder Paternal Grandmother   . Colon polyps Paternal Aunt        x 2  . Esophageal cancer Neg Hx   . Rectal cancer Neg Hx   . Stomach cancer Neg Hx   . Breast cancer Neg Hx     Social History Social History   Tobacco Use  . Smoking status: Former Smoker    Packs/day: 0.00    Years: 27.00    Pack years: 0.00    Types: Cigarettes    Quit date: 02/24/2018    Years since quitting: 2.0  . Smokeless tobacco: Never Used  Vaping Use  . Vaping Use: Some days  . Start date: 02/24/2018  Substance Use Topics  . Alcohol use: Yes    Alcohol/week: 6.0 standard drinks    Types: 6 Standard drinks or equivalent per week  . Drug use: No     Allergies   Patient has no known allergies.   Review of Systems Review of Systems  Constitutional: Negative for chills and fever.  HENT: Negative for congestion, ear pain, sore throat, trouble swallowing and voice change.   Respiratory: Negative for cough and shortness of breath.   Cardiovascular: Negative for chest pain and palpitations.  Gastrointestinal: Negative for abdominal pain, diarrhea, nausea and vomiting.  Musculoskeletal: Negative for arthralgias, back pain and  myalgias.  Skin: Negative for rash.  All other systems reviewed and are negative.    Physical Exam Triage Vital Signs ED Triage Vitals  Enc Vitals Group     BP 03/15/20  1627 119/83     Pulse Rate 03/15/20 1627 91     Resp 03/15/20 1627 18     Temp 03/15/20 1627 98.6 F (37 C)     Temp Source 03/15/20 1627 Oral     SpO2 03/15/20 1627 100 %     Weight --      Height --      Head Circumference --      Peak Flow --      Pain Score 03/15/20 1630 1     Pain Loc --      Pain Edu? --      Excl. in [place]? --    No data found.  Updated Vital Signs BP 119/83 (BP Location: Right Arm)   Pulse 91   Temp 98.6 F (37 C) (Oral)   Resp 18   LMP 05/18/2015   SpO2 100%   Visual Acuity Right Eye Distance:   Left Eye Distance:   Bilateral Distance:    Right Eye Near:   Left Eye Near:    Bilateral Near:     Physical Exam Vitals and nursing note reviewed.  Constitutional:      General: She is not in acute distress.    Appearance: Normal appearance. She is well-developed and well-nourished. She is not ill-appearing, toxic-appearing or diaphoretic.  HENT:     Head: Normocephalic and atraumatic.     Right Ear: Tympanic membrane and ear canal normal.     Left Ear: Tympanic membrane and ear canal normal.     Nose: Nose normal.     Right Sinus: No maxillary sinus tenderness or frontal sinus tenderness.     Left Sinus: No maxillary sinus tenderness or frontal sinus tenderness.     Mouth/Throat:     Lips: Pink.     Mouth: Mucous membranes are moist.     Pharynx: Oropharynx is clear. Uvula midline. No pharyngeal swelling, oropharyngeal exudate, posterior oropharyngeal erythema or uvula swelling.  Eyes:     Extraocular Movements: EOM normal.  Cardiovascular:     Rate and Rhythm: Normal rate and regular rhythm.  Pulmonary:     Effort: Pulmonary effort is normal. No respiratory distress.     Breath sounds: No stridor. Wheezing (faint in upper lung fields) present. No rhonchi or rales.   Musculoskeletal:        General: Normal range of motion.     Cervical back: Normal range of motion.  Skin:    General: Skin is warm and dry.  Neurological:     Mental Status: She is alert and oriented to person, place, and time.  Psychiatric:        Mood and Affect: Mood and affect normal.        Behavior: Behavior normal.      UC Treatments / Results  Labs (all labs ordered are listed, but only abnormal results are displayed) Labs Reviewed - No data to display  EKG   Radiology DG Chest 2 View  Result Date: 03/15/2020 CLINICAL DATA:  Cough, chills and chest pain x1 week. EXAM: CHEST - 2 VIEW COMPARISON:  March 07, 2016 FINDINGS: The heart size and mediastinal contours are within normal limits. Both lungs are clear. The visualized skeletal structures are unremarkable. IMPRESSION: No active cardiopulmonary disease. Electronically Signed   By: Virgina Norfolk M.D.   On: 03/15/2020 17:15    Procedures Procedures (including critical care time)  Medications Ordered in UC Medications - No data to display  Initial Impression / Assessment  and Plan / UC Course  I have reviewed the triage vital signs and the nursing notes.  Pertinent labs & imaging results that were available during my care of the patient were reviewed by me and considered in my medical decision making (see chart for details).     Reviewed imaging with pt No evidence of bacterial infection at this time Encouraged symptomatic tx for COVID at this time Discussed symptoms that warrant emergent care in the ED. AVS given  Final Clinical Impressions(s) / UC Diagnoses   Final diagnoses:  COVID  Viral URI with cough  Wheeze     Discharge Instructions      You may take 500mg  acetaminophen every 4-6 hours or in combination with ibuprofen 400-600mg  every 6-8 hours as needed for pain, inflammation, and fever.  Be sure to well hydrated with clear liquids and get at least 8 hours of sleep at night,  preferably more while sick.   Please follow up with family medicine in 1 week if needed.   Call 911 or have someone drive you to the hospital if symptoms significantly worsening including worsening chest pain, trouble breathing, dizziness/passing out, or other new concerning symptoms develop.     ED Prescriptions    Medication Sig Dispense Auth. Provider   albuterol (VENTOLIN HFA) 108 (90 Base) MCG/ACT inhaler Inhale 2 puffs into the lungs every 4 (four) hours as needed for wheezing or shortness of breath. 1 each Noe Gens, PA-C   predniSONE (DELTASONE) 20 MG tablet 3 tabs po day one, then 2 po daily x 4 days 11 tablet Noe Gens, [place]     PDMP not reviewed this encounter.   Noe Gens, [place] 03/15/20 1725

## 2020-03-15 NOTE — ED Triage Notes (Signed)
Exposed to covid 1/11, sxs started a week ago. Rapid from [facility] positive yesterday. Non productive "hacky" cough, chills, chest tightness. Hx yearly bronchitis. Robitussin, tylenol and mucinex prn. Had had both covid vaccines.

## 2020-03-15 NOTE — Discharge Instructions (Signed)
  You may take 500mg  acetaminophen every 4-6 hours or in combination with ibuprofen 400-600mg  every 6-8 hours as needed for pain, inflammation, and fever.  Be sure to well hydrated with clear liquids and get at least 8 hours of sleep at night, preferably more while sick.   Please follow up with family medicine in 1 week if needed.   Call 911 or have someone drive you to the hospital if symptoms significantly worsening including worsening chest pain, trouble breathing, dizziness/passing out, or other new concerning symptoms develop.

## 2020-05-02 ENCOUNTER — Emergency Department (INDEPENDENT_AMBULATORY_CARE_PROVIDER_SITE_OTHER): Payer: 59

## 2020-05-02 ENCOUNTER — Emergency Department
Admission: EM | Admit: 2020-05-02 | Discharge: 2020-05-02 | Disposition: A | Discharge status: Home / Self Care | Payer: 59 | Source: Home / Self Care | Attending: Family Medicine | Admitting: Family Medicine

## 2020-05-02 ENCOUNTER — Encounter: Payer: Self-pay | Admitting: Emergency Medicine

## 2020-05-02 ENCOUNTER — Other Ambulatory Visit: Payer: Self-pay

## 2020-05-02 DIAGNOSIS — R5383 Other fatigue: Secondary | ICD-10-CM | POA: Diagnosis not present

## 2020-05-02 DIAGNOSIS — R079 Chest pain, unspecified: Secondary | ICD-10-CM

## 2020-05-02 DIAGNOSIS — M940 Chondrocostal junction syndrome [Tietze]: Secondary | ICD-10-CM

## 2020-05-02 NOTE — Discharge Instructions (Addendum)
May take Ibuprofen 200mg , 4 tabs every 8 hours with food.  Put ice on the painful area. To do this: Put ice in a plastic bag. Place a towel between your skin and the bag. Leave the ice on for 20 minutes, 2-3 times a day.  If symptoms become significantly worse during the night or over the weekend, proceed to the local emergency room.

## 2020-05-02 NOTE — ED Provider Notes (Signed)
[facility]    CSN: [id number] Arrival date & time: 05/02/20  1222      History   Chief Complaint Right jaw pain and chest pain  HPI Tanya Kelley is a 50 y.o. female.   Four days ago patient awoke with burning right jaw pain radiating into her right neck, and chills without fever.  Yesterday she developed non-radiating squeezing tightness in her anterior mid-chest.  She denies shortness of breath and nausea/vomiting.  She recalls that 10 days ago she developed pain/swelling in her right neck that lasted several days before resolving.  She complains of feeling fatigued for about 10 days. Patient reports that she had COVID19 infection in January without sequelae. Patient ha a history of Barretts esophagus and notes that a follow-up endoscopy was negative.  The history is provided by the patient.    Past Medical History:  Diagnosis Date  . ABDOMINAL BLOATING 04/10/2009   Qualifier: Diagnosis of  By: Nelson-Smith CMA (AAMA), Dottie    . Abdominal pain, unspecified site 04/10/2009   Qualifier: Diagnosis of  By: Nelson-Smith CMA (AAMA), Dottie    . ANEMIA, IRON DEFICIENCY 04/12/2009   Qualifier: Diagnosis of  By: Surface RN, Butch Penny    . Anxiety    hx of  . B12 DEFICIENCY 04/12/2009   Qualifier: Diagnosis of  By: Surface RN, Butch Penny    . Barrett esophagus 2013  . CERVICAL RADICULOPATHY, RIGHT 06/26/2009   Qualifier: Diagnosis of  By: Lorelei Pont MD, Frederico Hamman    . Chest pain   . Colon polyps   . CONSTIPATION 04/10/2009   Qualifier: Diagnosis of  By: Nelson-Smith CMA (AAMA), Dottie    . COPD 04/10/2009   no per pt 05-25-15  . Depression    hx of  . Diarrhea 04/10/2009   Qualifier: Diagnosis of  By: Nelson-Smith CMA (AAMA), Dottie    . Fatigue   . GERD (gastroesophageal reflux disease)    OTC meds at times  . Hot flashes   . Hyperlipidemia    hx of  . Jaw pain   . MELENA 04/10/2009   Qualifier: Diagnosis of  By: Nelson-Smith CMA (AAMA), Dottie    . Nausea   . PONV  (postoperative nausea and vomiting)   . SACROILIITIS, RIGHT 06/26/2009   Qualifier: Diagnosis of  By: Lorelei Pont MD, Frederico Hamman  ---hx of  . Shoulder pain   . WEIGHT LOSS 04/10/2009   Qualifier: Diagnosis of  By: Harlon Ditty CMA (AAMA), Dottie      Patient Active Problem List   Diagnosis Date Noted  . Bipolar disorder current episode depressed ([place]) 12/27/2017  . Adjustment disorder with depressed mood 05/09/2017  . Delusional disorder ([place]) 05/09/2017  . Weight loss, unintentional 01/05/2017  . Breast mass 01/05/2017  . Right knee pain 11/19/2015  . Effusion of bursa of right knee 11/19/2015  . Lymphadenitis 05/18/2015  . 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 Hyperlipidemia LDL goal <100 04/09/2015  . Vitamin D deficiency 04/09/2015  . Tobacco use disorder 04/06/2015  . Encounter to establish care 04/06/2015  . Encounter for preventive health examination 04/06/2015  . Barrett esophagus   . Chest pain   . Hot flashes     Past Surgical History:  Procedure Laterality Date  . CHOLECYSTECTOMY    . COLONOSCOPY    . EXCISION MORTON'S NEUROMA Bilateral 01/29/2017   Procedure: Morton's Neuroma Excision [employer];  Surgeon: Wylene Simmer, MD;  Location: [place];  Service: Orthopedics;  Laterality: Bilateral;  .  FOOT SURGERY Bilateral   . TUBAL LIGATION    . UPPER GASTROINTESTINAL ENDOSCOPY  06/12/2015  . UPPER GASTROINTESTINAL ENDOSCOPY  01/05/2020  . WEIL OSTEOTOMY Bilateral 01/29/2017   Procedure: Bilateral Second Metatarsal Weil Osteotomy;  Surgeon: Wylene Simmer, MD;  Location: [place];  Service: Orthopedics;  Laterality: Bilateral;    OB History    Gravida  3   Para  3   Term      Preterm      AB      Living  3     SAB      IAB      Ectopic      Multiple      Live Births               Home Medications    Prior to Admission medications   Medication Sig Start Date End Date Taking? Authorizing Provider  omeprazole  (PRILOSEC) 20 MG capsule Take 20 mg by mouth daily.    [provider]  valACYclovir (VALTREX) 1000 MG tablet Take by mouth daily as needed. 02/15/19   [provider]    Family History Family History  Problem Relation Age of Onset  . Hypertension Father   . Clotting disorder Father   . Colon polyps Father   . Healthy Mother   . Healthy Brother   . Heart attack Paternal Grandfather   . Heart disease Paternal Grandfather   . Early death Paternal Grandfather 9       MI  . Colon cancer Paternal Grandmother 66  . Clotting disorder Paternal Grandmother   . Colon polyps Paternal Aunt        x 2  . Esophageal cancer Neg Hx   . Rectal cancer Neg Hx   . Stomach cancer Neg Hx   . Breast cancer Neg Hx     Social History Social History   Tobacco Use  . Smoking status: Former Smoker    Packs/day: 0.00    Years: 27.00    Pack years: 0.00    Types: Cigarettes    Quit date: 02/24/2018    Years since quitting: 2.1  . Smokeless tobacco: Never Used  Vaping Use  . Vaping Use: Some days  . Start date: 02/24/2018  Substance Use Topics  . Alcohol use: Yes    Alcohol/week: 6.0 standard drinks    Types: 6 Standard drinks or equivalent per week  . Drug use: No     Allergies   Patient has no known allergies.   Review of Systems Review of Systems No sore throat No cough No pleuritic pain but feels tight in anterior chest. No wheezing No nasal congestion No post-nasal drainage No sinus pain/pressure No itchy/red eyes No earache No hemoptysis No SOB No fever/chills No nausea No vomiting No abdominal pain No diarrhea No urinary symptoms No skin rash + fatigue No myalgias No headache   Physical Exam Triage Vital Signs ED Triage Vitals  Enc Vitals Group     BP 05/02/20 1231 117/79     Pulse Rate 05/02/20 1231 (!) 109     Resp 05/02/20 1231 18     Temp 05/02/20 1231 98.2 F (36.8 C)     Temp Source 05/02/20 1231 Oral     SpO2 05/02/20 1231 98 %      Weight 05/02/20 1232 180 lb (81.6 kg)     Height 05/02/20 1232 5\' 8"  (1.727 m)     Head Circumference --  Peak Flow --      Pain Score 05/02/20 1232 4     Pain Loc --      Pain Edu? --      Excl. in [place]? --    No data found.  Updated Vital Signs BP 117/79 (BP Location: Right Arm)   Pulse (!) 109   Temp 98.2 F (36.8 C) (Oral)   Resp 18   Ht 5\' 8"  (1.727 m)   Wt 81.6 kg   LMP 05/18/2015   SpO2 98%   BMI 27.37 kg/m   Visual Acuity Right Eye Distance:   Left Eye Distance:   Bilateral Distance:    Right Eye Near:   Left Eye Near:    Bilateral Near:     Physical Exam Vitals and nursing note reviewed.  Constitutional:      General: She is not in acute distress. HENT:     Head: Normocephalic.     Right Ear: External ear normal.     Left Ear: External ear normal.     Nose: Nose normal.     Mouth/Throat:     Pharynx: Oropharynx is clear.  Eyes:     Conjunctiva/sclera: Conjunctivae normal.  Cardiovascular:     Rate and Rhythm: Regular rhythm. Tachycardia present.     Heart sounds: Normal heart sounds.  Pulmonary:     Breath sounds: Normal breath sounds.  Chest:       Comments: Patient has pain over upper sternum as noted on diagram, but there is no tenderness to palpation in this area.  However, patient reports that the pain is increased after palpation of her sternum. Abdominal:     Palpations: Abdomen is soft.     Tenderness: There is no abdominal tenderness.  Musculoskeletal:        General: No tenderness.     Cervical back: Neck supple.     Right lower leg: No edema.     Left lower leg: No edema.  Lymphadenopathy:     Cervical: No cervical adenopathy.  Skin:    General: Skin is warm and dry.     Findings: No rash.  Neurological:     General: No focal deficit present.     Mental Status: She is alert.      UC Treatments / Results  Labs (all labs ordered are listed, but only abnormal results are displayed) Labs Reviewed  CBC WITH  DIFFERENTIAL/PLATELET  COMPLETE METABOLIC PANEL WITH GFR  TSH    EKG  Rate:  88 BPM PR:  120 msec QT:  364 msec QTcH:  440 msec QRSD:  88 msec QRS axis:  59 degrees Interpretation:  within normal limits; normal sinus rhythm    Radiology DG Chest 2 View  Result Date: 05/02/2020 CLINICAL DATA:  Centralized chest pain for the past 4 days. EXAM: CHEST - 2 VIEW COMPARISON:  Chest x-ray dated March 15, 2020. FINDINGS: The heart size and mediastinal contours are within normal limits. Both lungs are clear. The visualized skeletal structures are unremarkable. IMPRESSION: No active cardiopulmonary disease. Electronically Signed   By: Titus Dubin M.D.   On: 05/02/2020 13:50    Procedures Procedures (including critical care time)  Medications Ordered in UC Medications - No data to display  Initial Impression / Assessment and Plan / UC Course  I have reviewed the triage vital signs and the nursing notes.  Pertinent labs & imaging results that were available during my care of the patient were reviewed by me and  considered in my medical decision making (see chart for details).    Normal EKG and chest x-ray reassuring.  Suspect costochondritis. CBC, CMP, TSH pending. Followup with Family Doctor if not improved in about 2 weeks.   Final Clinical Impressions(s) / UC Diagnoses   Final diagnoses:  Chest pain, unspecified type  Fatigue, unspecified type  Costochondritis     Discharge Instructions     May take Ibuprofen 200mg , 4 tabs every 8 hours with food.  1. Put ice on the painful area. To do this: ? Put ice in a plastic bag. ? Place a towel between your skin and the bag. ? Leave the ice on for 20 minutes, 2-3 times a day.  If symptoms become significantly worse during the night or over the weekend, proceed to the local emergency room.     ED Prescriptions    None        Kandra Nicolas, MD 05/03/20 2236

## 2020-05-02 NOTE — ED Triage Notes (Signed)
Rt jaw pain, upper back pain, chest discomfort, neck pain started  X 4 days ago. Vaccinated

## 2020-05-03 LAB — CBC WITH DIFFERENTIAL/PLATELET
Absolute Monocytes: 451 cells/uL (ref 200–950)
Basophils Absolute: 49 cells/uL (ref 0–200)
Basophils Relative: 0.6 %
Eosinophils Absolute: 213 cells/uL (ref 15–500)
Eosinophils Relative: 2.6 %
HCT: 37.8 % (ref 35.0–45.0)
Hemoglobin: 13.1 g/dL (ref 11.7–15.5)
Lymphs Abs: 3321 cells/uL (ref 850–3900)
MCH: 30.4 pg (ref 27.0–33.0)
MCHC: 34.7 g/dL (ref 32.0–36.0)
MCV: 87.7 fL (ref 80.0–100.0)
MPV: 11.2 fL (ref 7.5–12.5)
Monocytes Relative: 5.5 %
Neutro Abs: 4166 cells/uL (ref 1500–7800)
Neutrophils Relative %: 50.8 %
Platelets: 219 10*3/uL (ref 140–400)
RBC: 4.31 10*6/uL (ref 3.80–5.10)
RDW: 12.6 % (ref 11.0–15.0)
Total Lymphocyte: 40.5 %
WBC: 8.2 10*3/uL (ref 3.8–10.8)

## 2020-05-03 LAB — COMPLETE METABOLIC PANEL WITH GFR
AG Ratio: 2 (calc) (ref 1.0–2.5)
ALT: 19 U/L (ref 6–29)
AST: 17 U/L (ref 10–35)
Albumin: 4.4 g/dL (ref 3.6–5.1)
Alkaline phosphatase (APISO): 77 U/L (ref 31–125)
BUN: 17 mg/dL (ref 7–25)
CO2: 24 mmol/L (ref 20–32)
Calcium: 9.8 mg/dL (ref 8.6–10.2)
Chloride: 106 mmol/L (ref 98–110)
Creat: 0.81 mg/dL (ref 0.50–1.10)
GFR, Est African American: 100 mL/min/{1.73_m2} (ref >=60)
GFR, Est Non African American: 86 mL/min/{1.73_m2} (ref >=60)
Globulin: 2.2 g/dL (calc) (ref 1.9–3.7)
Glucose, Bld: 89 mg/dL (ref 65–99)
Potassium: 4 mmol/L (ref 3.5–5.3)
Sodium: 142 mmol/L (ref 135–146)
Total Bilirubin: 0.2 mg/dL (ref 0.2–1.2)
Total Protein: 6.6 g/dL (ref 6.1–8.1)

## 2020-05-03 LAB — TSH: TSH: 1.45 mIU/L

## 2021-05-07 ENCOUNTER — Other Ambulatory Visit: Payer: Self-pay

## 2021-05-07 ENCOUNTER — Emergency Department
Admission: EM | Admit: 2021-05-07 | Discharge: 2021-05-07 | Disposition: A | Discharge status: Home / Self Care | Payer: Self-pay | Source: Home / Self Care | Attending: Family Medicine | Admitting: Family Medicine

## 2021-05-07 ENCOUNTER — Emergency Department (INDEPENDENT_AMBULATORY_CARE_PROVIDER_SITE_OTHER): Payer: Self-pay

## 2021-05-07 DIAGNOSIS — R1031 Right lower quadrant pain: Secondary | ICD-10-CM

## 2021-05-07 DIAGNOSIS — R110 Nausea: Secondary | ICD-10-CM

## 2021-05-07 DIAGNOSIS — R933 Abnormal findings on diagnostic imaging of other parts of digestive tract: Secondary | ICD-10-CM

## 2021-05-07 LAB — POCT URINALYSIS DIP (MANUAL ENTRY)
Bilirubin, UA: NEGATIVE
Glucose, UA: NEGATIVE mg/dL
Ketones, POC UA: NEGATIVE mg/dL
Leukocytes, UA: NEGATIVE
Nitrite, UA: NEGATIVE
Protein Ur, POC: NEGATIVE mg/dL
Spec Grav, UA: <=1.005 — AB (ref 1.010–1.025)
Urobilinogen, UA: 0.2 E.U./dL
pH, UA: 6 (ref 5.0–8.0)

## 2021-05-07 MED ORDER — SIMETHICONE 80 MG PO CHEW: CHEWABLE_TABLET | ORAL | 0 refills | Status: AC

## 2021-05-07 MED ORDER — IOHEXOL 9 MG/ML PO SOLN: 500.0000 mL | ORAL | Status: DC

## 2021-05-07 MED ORDER — IOHEXOL 300 MG/ML  SOLN
100.0000 mL | Freq: Once | INTRAMUSCULAR | Status: AC | PRN
  Administered 2021-05-07: 100 mL via INTRAVENOUS

## 2021-05-07 NOTE — Discharge Instructions (Signed)
Drink lots of water ?Take a walk every day that you can ?Increase fiber in diet ?Increase the clear lax until improved ?May take simethicone for gas pain ?May need to go to ER if worse ?

## 2021-05-07 NOTE — ED Triage Notes (Signed)
Pt c/o LRQ abd pain since Fri night. Pain is sharp, worse with movement, and sometimes radiates to back. Nausea after eating. Pain 5/10 Ibuprofen.  ?

## 2021-05-07 NOTE — ED Notes (Signed)
20g PIV inserted for IV contrast. Attempt x1. Pt tolerated well.  ?

## 2021-05-07 NOTE — ED Provider Notes (Signed)
[facility]    CSN: [id number] Arrival date & time: 05/07/21  0810      History   Chief Complaint Chief Complaint  Patient presents with   Abdominal Pain    LRQ    HPI Tanya Kelley is a 51 y.o. female.   HPI  Patient is here for eval of right lower abdominal pain.  She has had trouble with constipation or weeks and has been taking " clear lax" which she  states is the equivalent of Miramax.  This helped, but still having bowel issues.  Has had to manually disimpact.  Today has loose bowels, "pencil thin" pain is worsening.  Decreased appetite'  feels distended and bloated.  No fever or chills. Has had colonoscopy which she states showed polyps, removed She has had a BTL and then subsequent lap for adhesions Post menopausal for 10 years No urinary symptoms    Past Medical History:  Diagnosis Date   ABDOMINAL BLOATING 04/10/2009   Qualifier: Diagnosis of  By: Tyus Kallam-Smith CMA (AAMA), Dottie     Abdominal pain, unspecified site 04/10/2009   Qualifier: Diagnosis of  By: Romi Rathel-Smith CMA (AAMA), Dottie     ANEMIA, IRON DEFICIENCY 04/12/2009   Qualifier: Diagnosis of  By: Surface RN, Donna     Anxiety    hx of   B12 DEFICIENCY 04/12/2009   Qualifier: Diagnosis of  By: Surface RN, Darleen Crocker esophagus 2013   CERVICAL RADICULOPATHY, RIGHT 06/26/2009   Qualifier: Diagnosis of  By: Patsy Lager MD, Spencer     Chest pain    Colon polyps    CONSTIPATION 04/10/2009   Qualifier: Diagnosis of  By: Fillmore Bynum-Smith CMA (AAMA), Dottie     COPD 04/10/2009   no per pt 05-25-15   Depression    hx of   Diarrhea 04/10/2009   Qualifier: Diagnosis of  By: Anastasiya Gowin-Smith CMA (AAMA), Dottie     Fatigue    GERD (gastroesophageal reflux disease)    OTC meds at times   Hot flashes    Hyperlipidemia    hx of   Jaw pain    MELENA 04/10/2009   Qualifier: Diagnosis of  By: Samentha Perham-Smith CMA (AAMA), Dottie     Nausea    PONV (postoperative nausea and vomiting)    SACROILIITIS,  RIGHT 06/26/2009   Qualifier: Diagnosis of  By: Patsy Lager MD, Spencer  ---hx of   Shoulder pain    WEIGHT LOSS 04/10/2009   Qualifier: Diagnosis of  By: Candice Camp CMA (AAMA), Dottie      Patient Active Problem List   Diagnosis Date Noted   Bipolar disorder current episode depressed (HCC) 12/27/2017   Adjustment disorder with depressed mood 05/09/2017   Delusional disorder (HCC) 05/09/2017   Weight loss, unintentional 01/05/2017   Breast mass 01/05/2017   Right knee pain 11/19/2015   Effusion of bursa of right knee 11/19/2015   Lymphadenitis 05/18/2015   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   Hyperlipidemia LDL goal <100 04/09/2015   Vitamin D deficiency 04/09/2015   Tobacco use disorder 04/06/2015   Encounter to establish care 04/06/2015   Encounter for preventive health examination 04/06/2015   Barrett esophagus    Chest pain    Hot flashes     Past Surgical History:  Procedure Laterality Date   CHOLECYSTECTOMY     COLONOSCOPY     EXCISION MORTON'S NEUROMA Bilateral 01/29/2017   Procedure: Morton's Neuroma Excision Second Web Space;  Surgeon: Toni Arthurs, MD;  Location: [facility]  [place];  Service: Orthopedics;  Laterality: Bilateral;   FOOT SURGERY Bilateral    TUBAL LIGATION     UPPER GASTROINTESTINAL ENDOSCOPY  06/12/2015   UPPER GASTROINTESTINAL ENDOSCOPY  01/05/2020   WEIL OSTEOTOMY Bilateral 01/29/2017   Procedure: Bilateral Second Metatarsal Weil Osteotomy;  Surgeon: Toni Arthurs, MD;  Location: [facility];  Service: Orthopedics;  Laterality: Bilateral;    OB History     Gravida  3   Para  3   Term      Preterm      AB      Living  3      SAB      IAB      Ectopic      Multiple      Live Births               Home Medications    Prior to Admission medications   Medication Sig Start Date End Date Taking? Authorizing Provider  simethicone (MYLICON) 80 MG chewable tablet One tab every 6 hours as needed gas pain 05/07/21   Yes Eustace Moore, MD  omeprazole (PRILOSEC) 20 MG capsule Take 20 mg by mouth daily.    [provider]  valACYclovir (VALTREX) 1000 MG tablet Take by mouth daily as needed. 02/15/19   [provider]    Family History Family History  Problem Relation Age of Onset   Hypertension Father    Clotting disorder Father    Colon polyps Father    Healthy Mother    Healthy Brother    Heart attack Paternal Grandfather    Heart disease Paternal Grandfather    Early death Paternal Grandfather 4       MI   Colon cancer Paternal Grandmother 71   Clotting disorder Paternal Grandmother    Colon polyps Paternal Aunt        x 2   Esophageal cancer Neg Hx    Rectal cancer Neg Hx    Stomach cancer Neg Hx    Breast cancer Neg Hx     Social History Social History   Tobacco Use   Smoking status: Former    Packs/day: 0.00    Years: 27.00    Pack years: 0.00    Types: Cigarettes    Quit date: 02/24/2018    Years since quitting: 3.2   Smokeless tobacco: Never  Vaping Use   Vaping Use: Some days   Start date: 02/24/2018  Substance Use Topics   Alcohol use: Yes    Alcohol/week: 6.0 standard drinks    Types: 6 Standard drinks or equivalent per week   Drug use: No     Allergies   Penicillins   Review of Systems Review of Systems  See HPI Physical Exam Triage Vital Signs  BP 122/86 (BP Location: Right Arm)   Pulse (!) 102   Temp 98.2 F (36.8 C) (Oral)   Resp 17   LMP 05/18/2015   SpO2 100%       Physical Exam Constitutional:      General: She is in acute distress.     Appearance: She is well-developed and normal weight.     Comments: uncomfortable  HENT:     Head: Normocephalic and atraumatic.     Mouth/Throat:     Mouth: Mucous membranes are moist.  Eyes:     Conjunctiva/sclera: Conjunctivae normal.     Pupils: Pupils are equal, round, and reactive to light.  Cardiovascular:  Rate and Rhythm: Regular rhythm. Tachycardia present.     Heart  sounds: Normal heart sounds.  Pulmonary:     Effort: Pulmonary effort is normal. No respiratory distress.     Breath sounds: Normal breath sounds.  Abdominal:     General: Abdomen is protuberant. Bowel sounds are increased. There is no distension.     Palpations: Abdomen is soft.     Tenderness: There is abdominal tenderness in the right lower quadrant. There is guarding.     Hernia: No hernia is present.     Comments: No mass or organomegaly  Musculoskeletal:        General: Normal range of motion.     Cervical back: Normal range of motion.  Skin:    General: Skin is warm and dry.  Neurological:     Mental Status: She is alert.     UC Treatments / Results  Labs (all labs ordered are listed, but only abnormal results are displayed) Labs Reviewed  POCT URINALYSIS DIP (MANUAL ENTRY) - Abnormal; Notable for the following components:      Result Value   Spec Grav, UA <=1.005 (*)    Blood, UA small (*)    All other components within normal limits    EKG   Radiology DG Abdomen 1 View  Result Date: 05/07/2021 CLINICAL DATA:  Right lower quadrant abdominal pain, nausea EXAM: ABDOMEN - 1 VIEW COMPARISON:  None. FINDINGS: Mildly to moderately dilated small bowel loops throughout the upper abdomen measuring up to 4.0 cm diameter. Mild colonic stool. No evidence of pneumatosis or pneumoperitoneum. Clear lung bases. No radiopaque nephrolithiasis. IMPRESSION: Mildly to moderately dilated small bowel loops throughout the upper abdomen, cannot exclude mid small bowel obstruction. Further evaluation with CT abdomen/pelvis with oral and IV contrast may be considered. Electronically Signed   By: Delbert Phenix M.D.   On: 05/07/2021 09:02   CT ABDOMEN PELVIS W CONTRAST  Result Date: 05/07/2021 CLINICAL DATA:  Right lower quadrant abdominal pain, abnormal KUB, small-bowel obstruction suspected EXAM: CT ABDOMEN AND PELVIS WITH CONTRAST TECHNIQUE: Multidetector CT imaging of the abdomen and pelvis  was performed using the standard protocol following bolus administration of intravenous contrast. RADIATION DOSE REDUCTION: This exam was performed according to the departmental dose-optimization program which includes automated exposure control, adjustment of the mA and/or kV according to patient size and/or use of iterative reconstruction technique. CONTRAST:  OMNIPAQUE IOHEXOL 300 MG/ML SOLN, additional oral enteric contrast COMPARISON:  02/20/2013 FINDINGS: Lower chest: No acute abnormality. Hepatobiliary: No focal liver abnormality is seen. Status post cholecystectomy. No biliary dilatation. Pancreas: Unremarkable. No pancreatic ductal dilatation or surrounding inflammatory changes. Spleen: Normal in size without significant abnormality. Adrenals/Urinary Tract: Adrenal glands are unremarkable. Kidneys are normal, without renal calculi, solid lesion, or hydronephrosis. Bladder is unremarkable. Stomach/Bowel: Stomach is within normal limits. Appendix appears normal. No evidence of bowel wall thickening, distention, or inflammatory changes. Sigmoid diverticula. Vascular/Lymphatic: Aortic atherosclerosis. No enlarged abdominal or pelvic lymph nodes. Reproductive: No mass or other significant abnormality. Other: No abdominal wall hernia or abnormality. No ascites. Musculoskeletal: No acute or significant osseous findings. IMPRESSION: 1. No acute CT findings of the abdomen or pelvis to explain right lower quadrant abdominal pain. No evidence of bowel obstruction. Normal appendix. 2. Sigmoid diverticulosis without evidence of acute diverticulitis. 3. Status post cholecystectomy. Aortic Atherosclerosis (ICD10-I70.0). Electronically Signed   By: Jearld Lesch M.D.   On: 05/07/2021 11:52    After KUB reviewed I discussed with patient going to  ER for care.  She prefers to get CT scan here and go to hospital only if essential  Medications Ordered in UC Medications  iohexol (OMNIPAQUE) 9 MG/ML oral solution 500  mL (has no administration in time range)    Initial Impression / Assessment and Plan / UC Course  I have reviewed the triage vital signs and the nursing notes.  Pertinent labs & imaging results that were available during my care of the patient were reviewed by me and considered in my medical decision making (see chart for details).     Discussed results with patient Recommend establishing with PCP Final Clinical Impressions(s) / UC Diagnoses   Final diagnoses:  Right lower quadrant abdominal pain     Discharge Instructions      Drink lots of water Take a walk every day that you can Increase fiber in diet Increase the clear lax until improved May take simethicone for gas pain May need to go to ER if worse     ED Prescriptions     Medication Sig Dispense Auth. Provider   simethicone (MYLICON) 80 MG chewable tablet One tab every 6 hours as needed gas pain 30 tablet Eustace Moore, MD      PDMP not reviewed this encounter.   Eustace Moore, MD 05/07/21 [phone]

## 2022-07-14 ENCOUNTER — Encounter: Payer: Self-pay | Admitting: Gastroenterology

## 2024-01-09 IMAGING — DX DG ABDOMEN 1V
2 series · 2 of 2 positions shown · non-contrast
Comparison: None.

CLINICAL DATA: Right lower quadrant abdominal pain, nausea

EXAM:
ABDOMEN - 1 VIEW

[abdomen kub (1 of 2)]
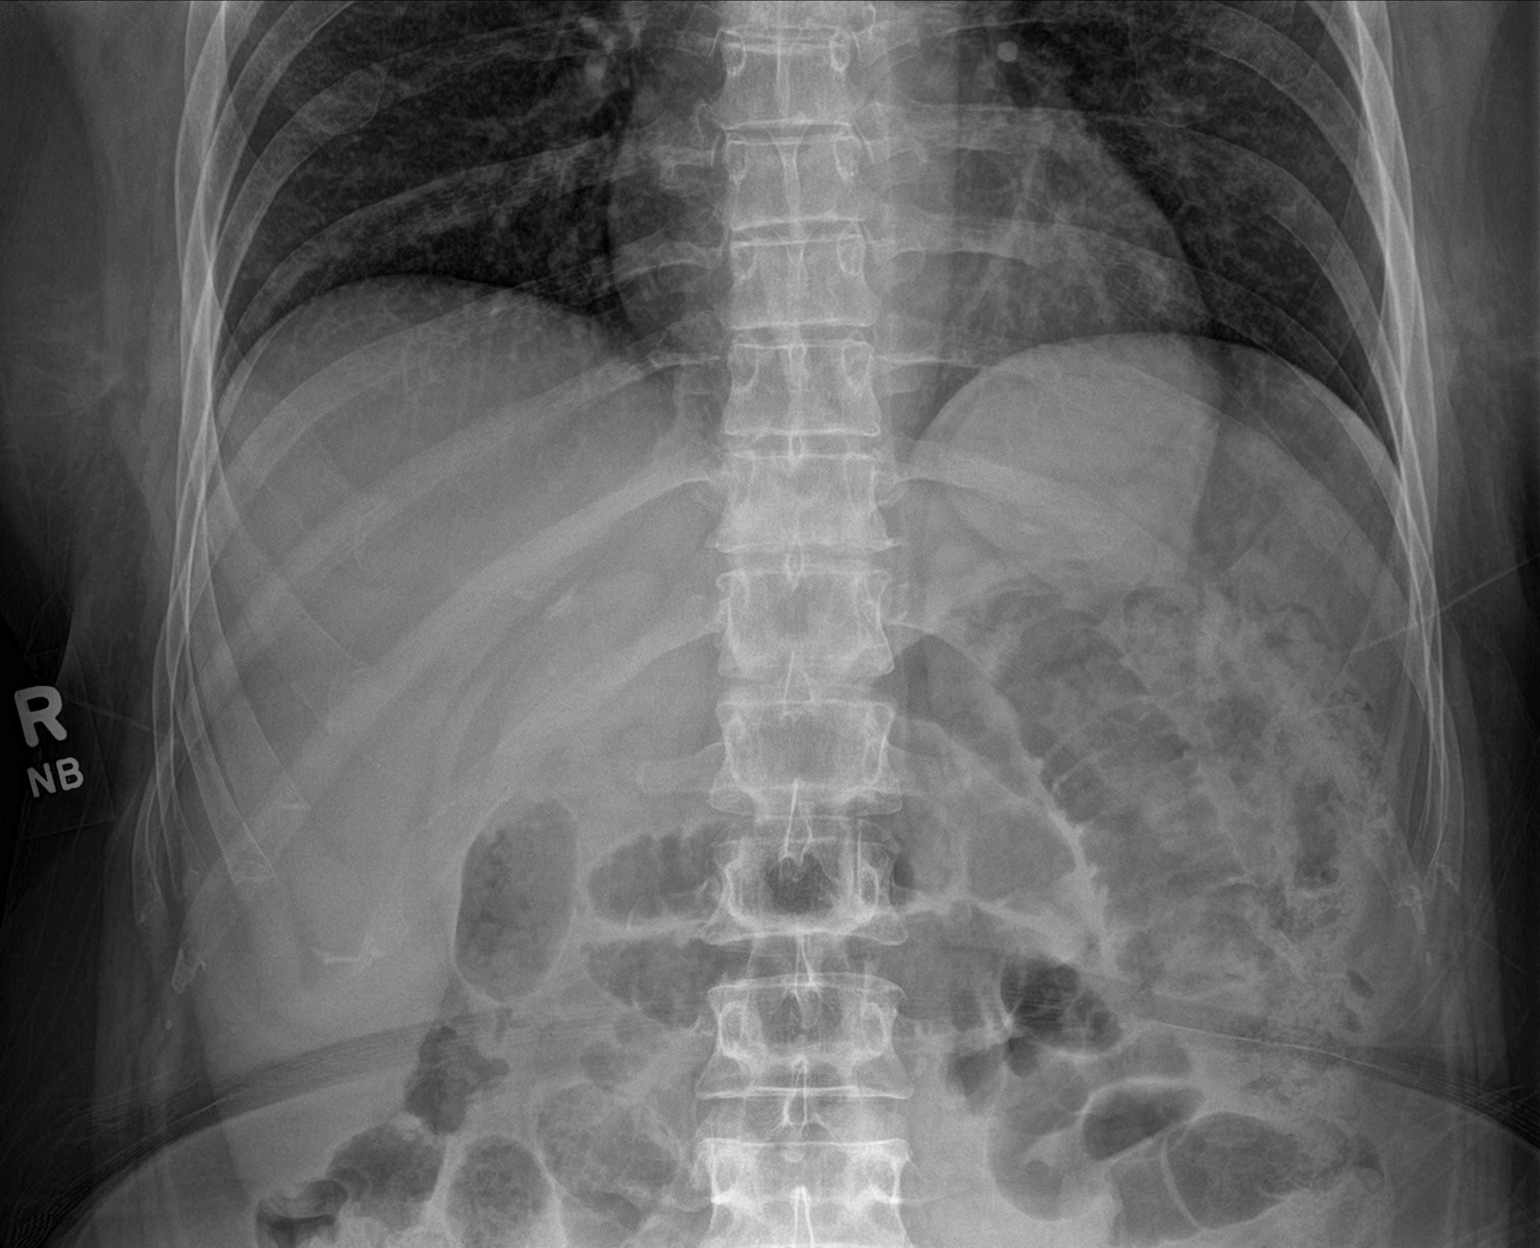

[abdomen kub (2 of 2)]
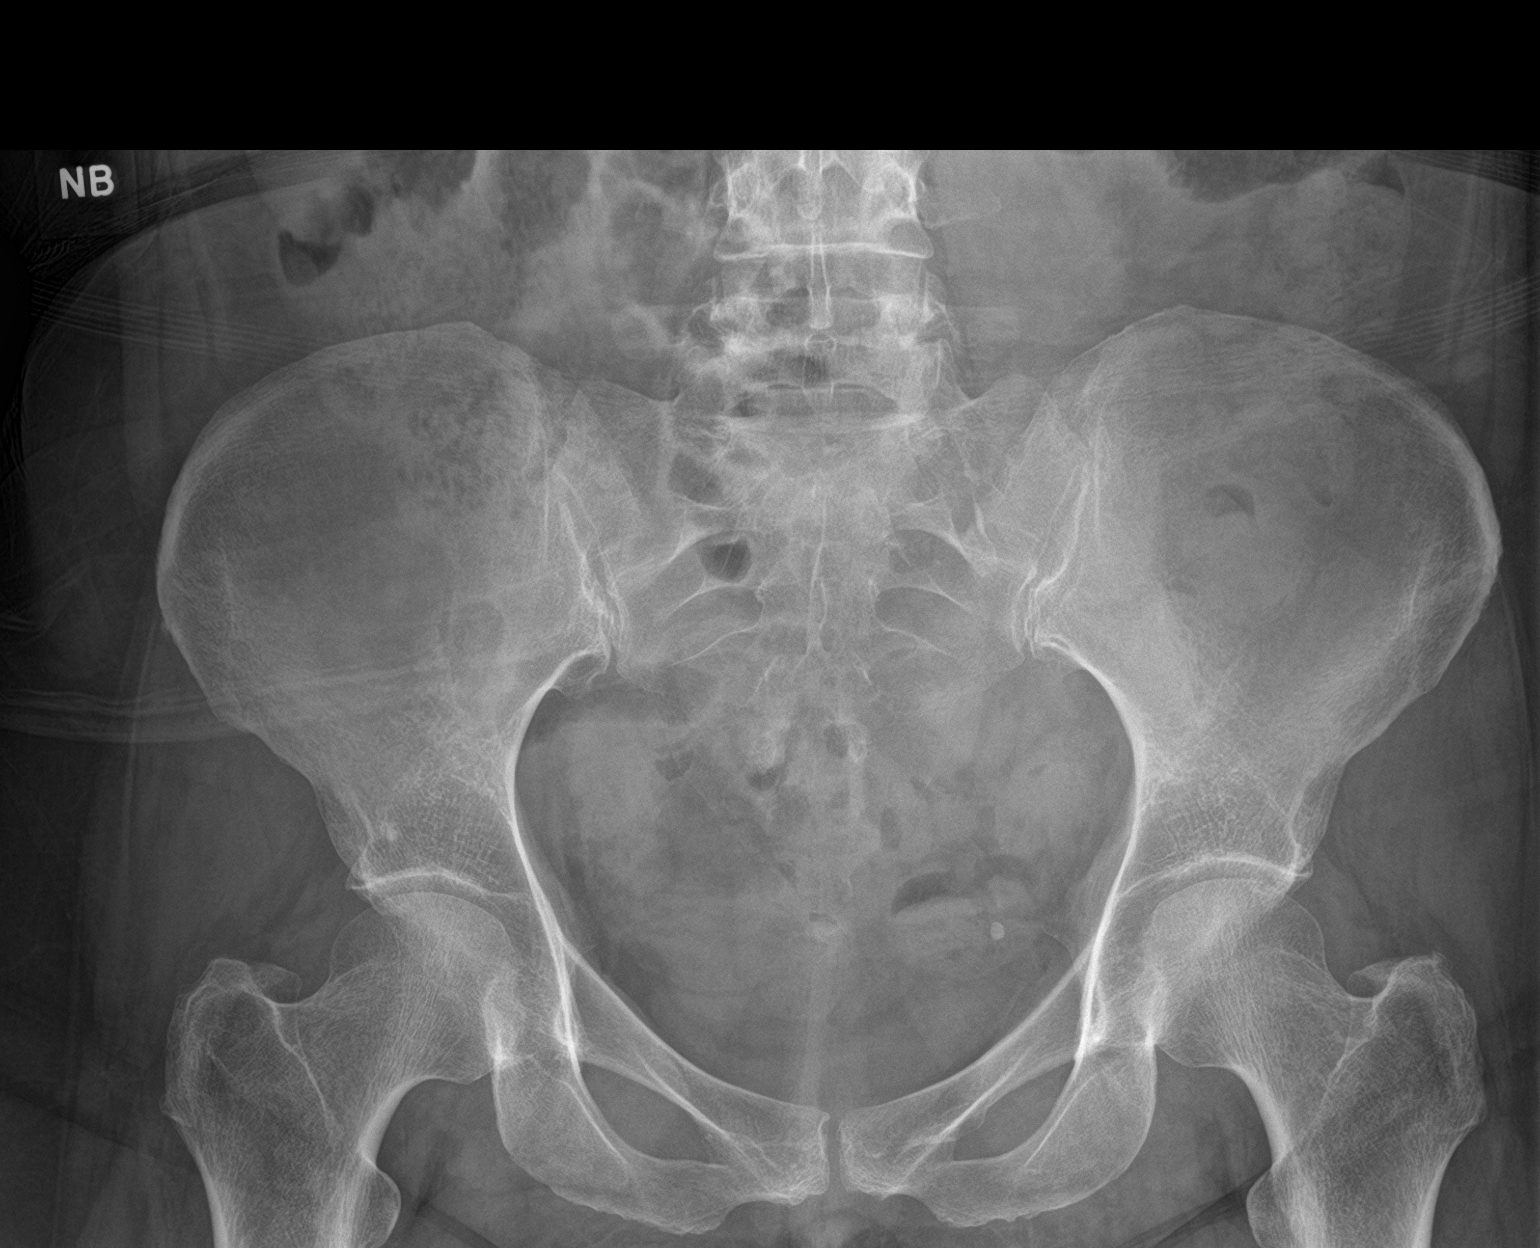

[2 of 2 positions shown; findings below may reference images not displayed]

FINDINGS: Mildly to moderately dilated small bowel loops throughout the upper
abdomen measuring up to 4.0 cm diameter. Mild colonic stool. No
evidence of pneumatosis or pneumoperitoneum. Clear lung bases. No
radiopaque nephrolithiasis.
IMPRESSION: Mildly to moderately dilated small bowel loops throughout the upper
abdomen, cannot exclude mid small bowel obstruction. Further
evaluation with CT abdomen/pelvis with oral and IV contrast may be
considered.

## 2024-01-09 IMAGING — CT CT ABD-PELV W/ CM
2 of 5 series · 16 of 46 positions shown, 18 images · IV contrast (APPLIED)
Comparison: 02/20/2013

CLINICAL DATA: Right lower quadrant abdominal pain, abnormal KUB,
small-bowel obstruction suspected

EXAM:
CT ABDOMEN AND PELVIS WITH CONTRAST
TECHNIQUE: Multidetector CT imaging of the abdomen and pelvis was performed
using the standard protocol following bolus administration of
intravenous contrast.

[Series 2: axial st · axial · 0.68mm/px · z∈[-496,-91]mm · 13 of 91 slices shown, 15 images]
[im 5/91  soft-tissue]
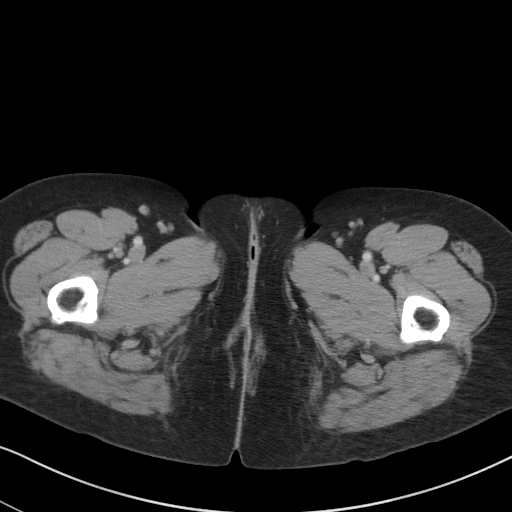
[im 5/91  bone]
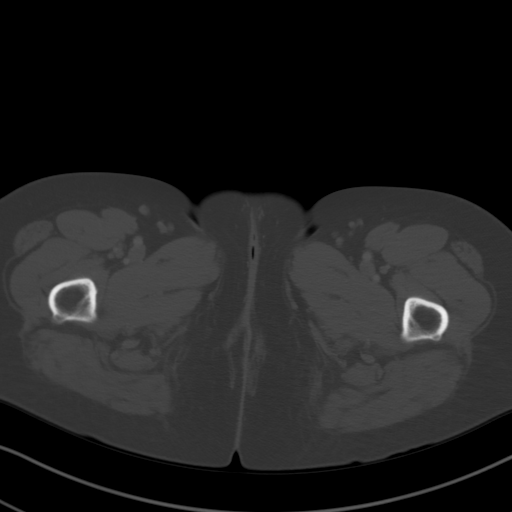
[im 15/91  soft-tissue]
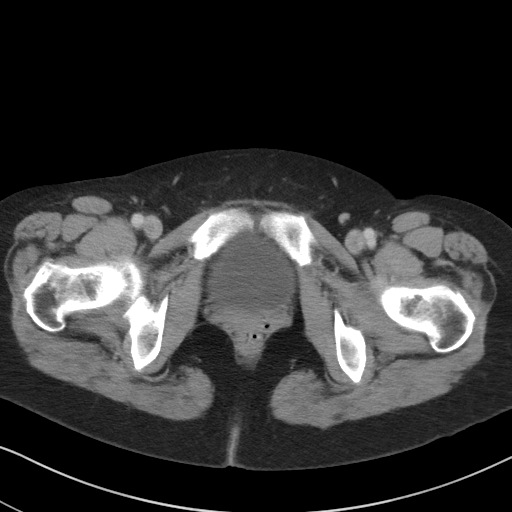
[im 19/91  soft-tissue]
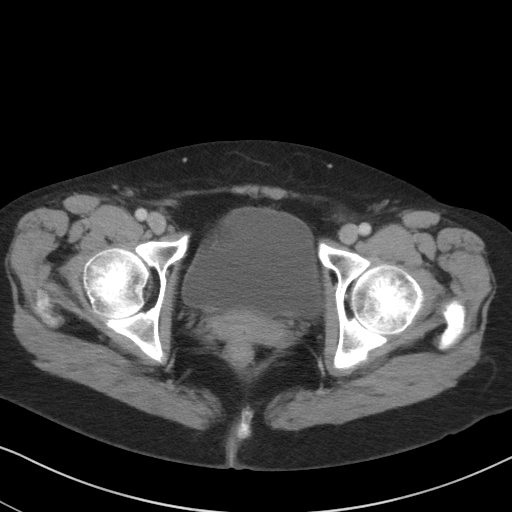
[im 24/91  soft-tissue]
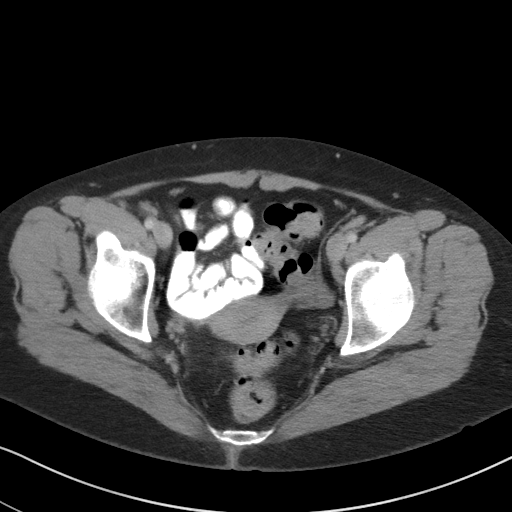
[im 34/91  soft-tissue]
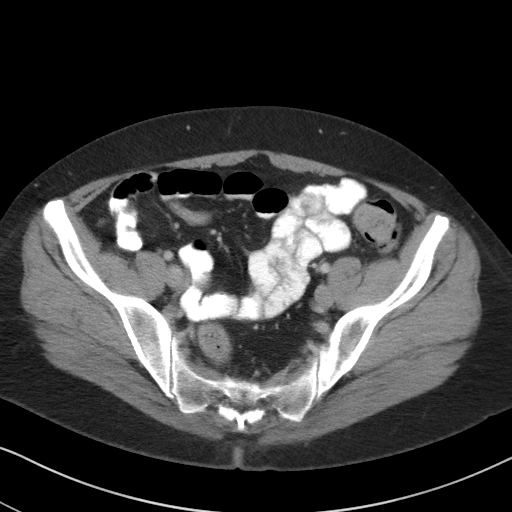
[im 38/91  soft-tissue]
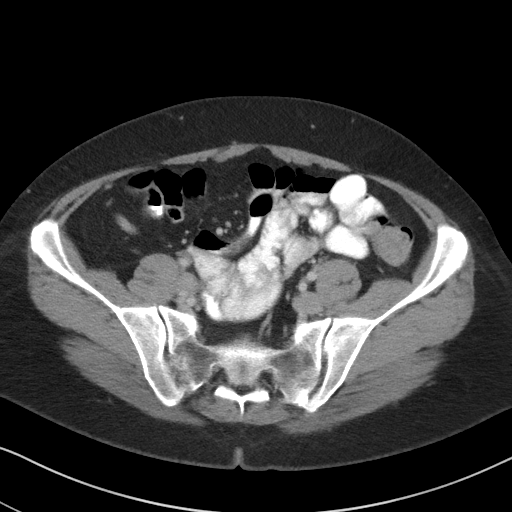
[im 48/91  soft-tissue]
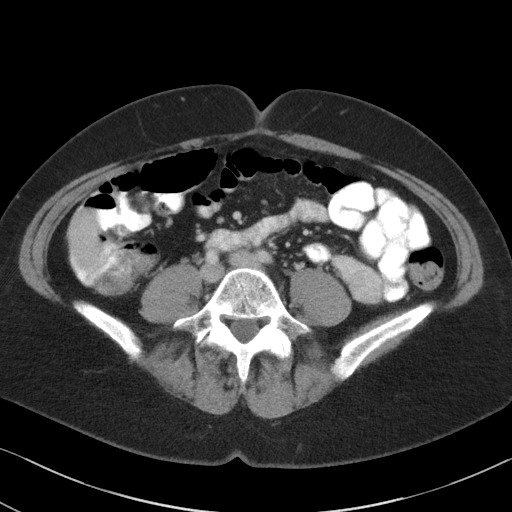
[im 53/91  soft-tissue]
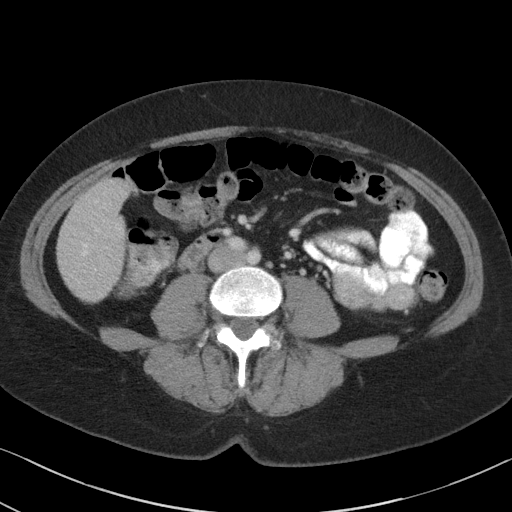
[im 57/91  soft-tissue]
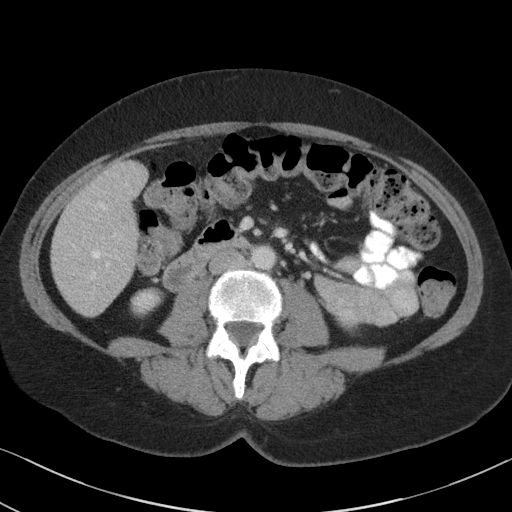
[im 57/91  bone]
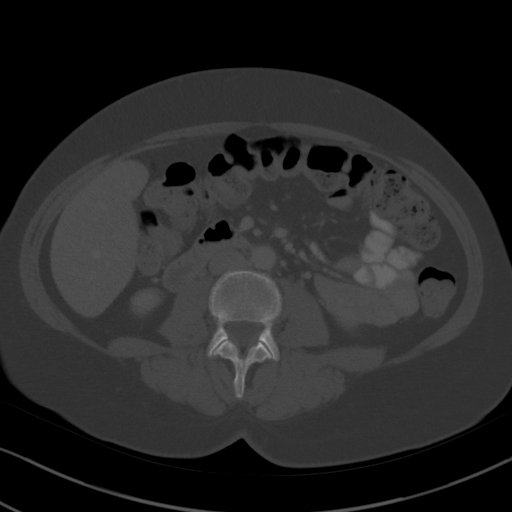
[im 67/91  soft-tissue]
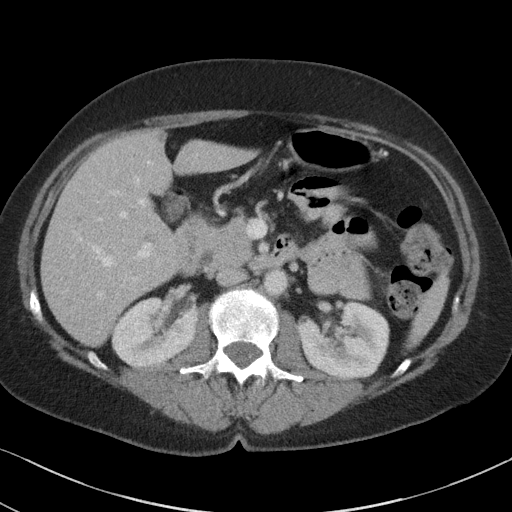
[im 72/91  soft-tissue]
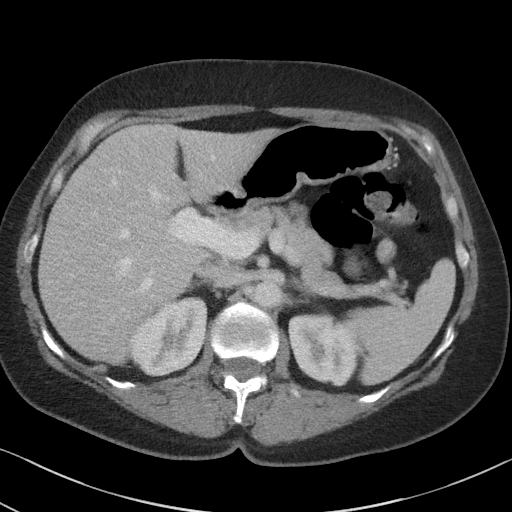
[im 76/91  soft-tissue]
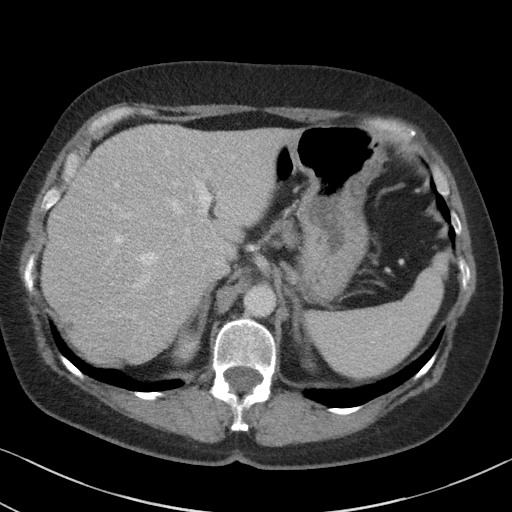
[im 86/91  soft-tissue]
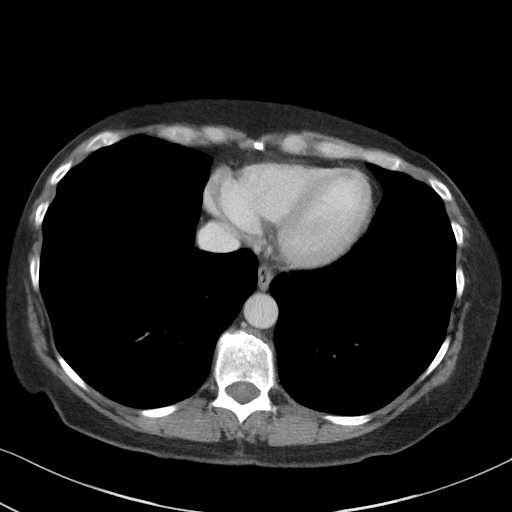

[Series 5: coronal st · coronal · 0.69mm/px · 3 of 80 slices shown]
[im 27/80  soft-tissue]
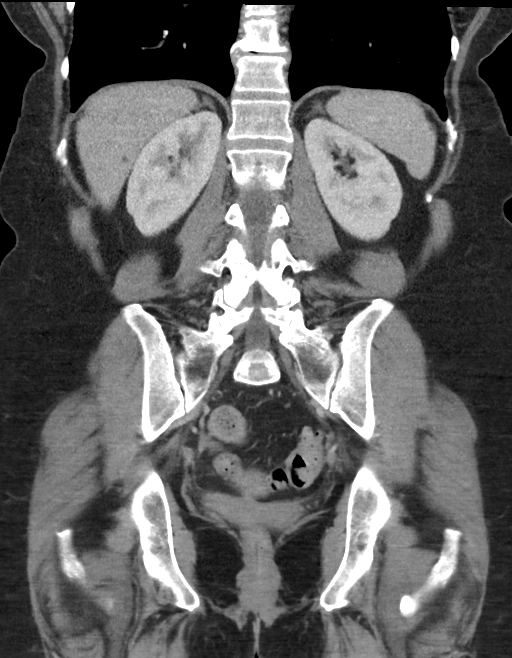
[im 36/80  soft-tissue]
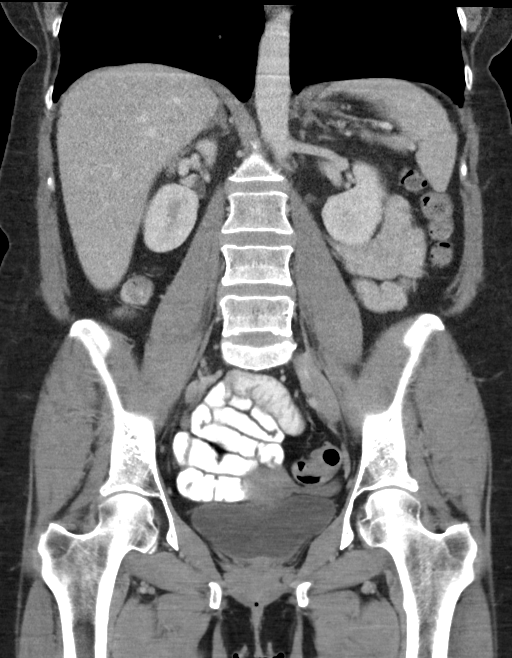
[im 44/80  soft-tissue]
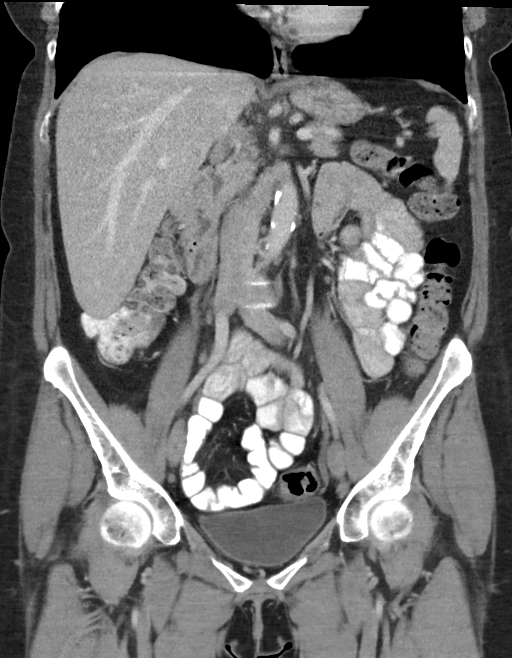

[16 of 46 positions shown; findings below may reference images not displayed]

RADIATION DOSE REDUCTION: This exam was performed according to the
departmental dose-optimization program which includes automated
exposure control, adjustment of the mA and/or kV according to
patient size and/or use of iterative reconstruction technique.

CONTRAST:  100mL OMNIPAQUE IOHEXOL 300 MG/ML SOLN, additional oral
enteric contrast
FINDINGS: Lower chest: No acute abnormality.

Hepatobiliary: No focal liver abnormality is seen. Status post
cholecystectomy. No biliary dilatation.

Pancreas: Unremarkable. No pancreatic ductal dilatation or
surrounding inflammatory changes.

Spleen: Normal in size without significant abnormality.

Adrenals/Urinary Tract: Adrenal glands are unremarkable. Kidneys are
normal, without renal calculi, solid lesion, or hydronephrosis.
Bladder is unremarkable.

Stomach/Bowel: Stomach is within normal limits. Appendix appears
normal. No evidence of bowel wall thickening, distention, or
inflammatory changes. Sigmoid diverticula.

Vascular/Lymphatic: Aortic atherosclerosis. No enlarged abdominal or
pelvic lymph nodes.

Reproductive: No mass or other significant abnormality.

Other: No abdominal wall hernia or abnormality. No ascites.

Musculoskeletal: No acute or significant osseous findings.
IMPRESSION: 1. No acute CT findings of the abdomen or pelvis to explain right
lower quadrant abdominal pain. No evidence of bowel obstruction.
Normal appendix.
2. Sigmoid diverticulosis without evidence of acute diverticulitis.
3. Status post cholecystectomy.

Aortic Atherosclerosis ([id number]).
